# Patient Record
Sex: Female | Born: 1955 | Race: White | Hispanic: No | Marital: Married | Smoking: Never smoker
Health system: Southern US, Community
[De-identification: ages and names within clinical notes are randomized; demographics above are authoritative.]

## PROBLEM LIST (undated history)

## (undated) DIAGNOSIS — L4050 Arthropathic psoriasis, unspecified: Secondary | ICD-10-CM

## (undated) HISTORY — DX: Arthropathic psoriasis, unspecified: L40.50

## (undated) HISTORY — PX: TUBAL LIGATION: SHX77

---

## 2014-04-16 ENCOUNTER — Encounter (INDEPENDENT_AMBULATORY_CARE_PROVIDER_SITE_OTHER): Payer: Self-pay

## 2014-04-16 ENCOUNTER — Other Ambulatory Visit (HOSPITAL_COMMUNITY): Payer: Self-pay | Admitting: Rheumatology

## 2014-04-16 ENCOUNTER — Ambulatory Visit (HOSPITAL_COMMUNITY)
Admission: RE | Admit: 2014-04-16 | Discharge: 2014-04-16 | Disposition: A | Discharge status: Home / Self Care | Payer: 59 | Source: Ambulatory Visit | Attending: Rheumatology | Admitting: Rheumatology

## 2014-04-16 DIAGNOSIS — I10 Essential (primary) hypertension: Secondary | ICD-10-CM | POA: Insufficient documentation

## 2014-04-16 DIAGNOSIS — Z9225 Personal history of immunosupression therapy: Secondary | ICD-10-CM

## 2014-04-19 LAB — LAB REPORT - SCANNED
EGFR: 89
HM HIV Screening: NEGATIVE
TSH: 3.79 (ref 0.41–5.90)

## 2016-06-02 IMAGING — CR DG CHEST 2V
2 series · 2 of 2 positions shown · non-contrast
Comparison: None.

CLINICAL DATA: Hypertension and screen before immunosuppressive
therapy.

EXAM:
CHEST  2 VIEW

[w chest pa]
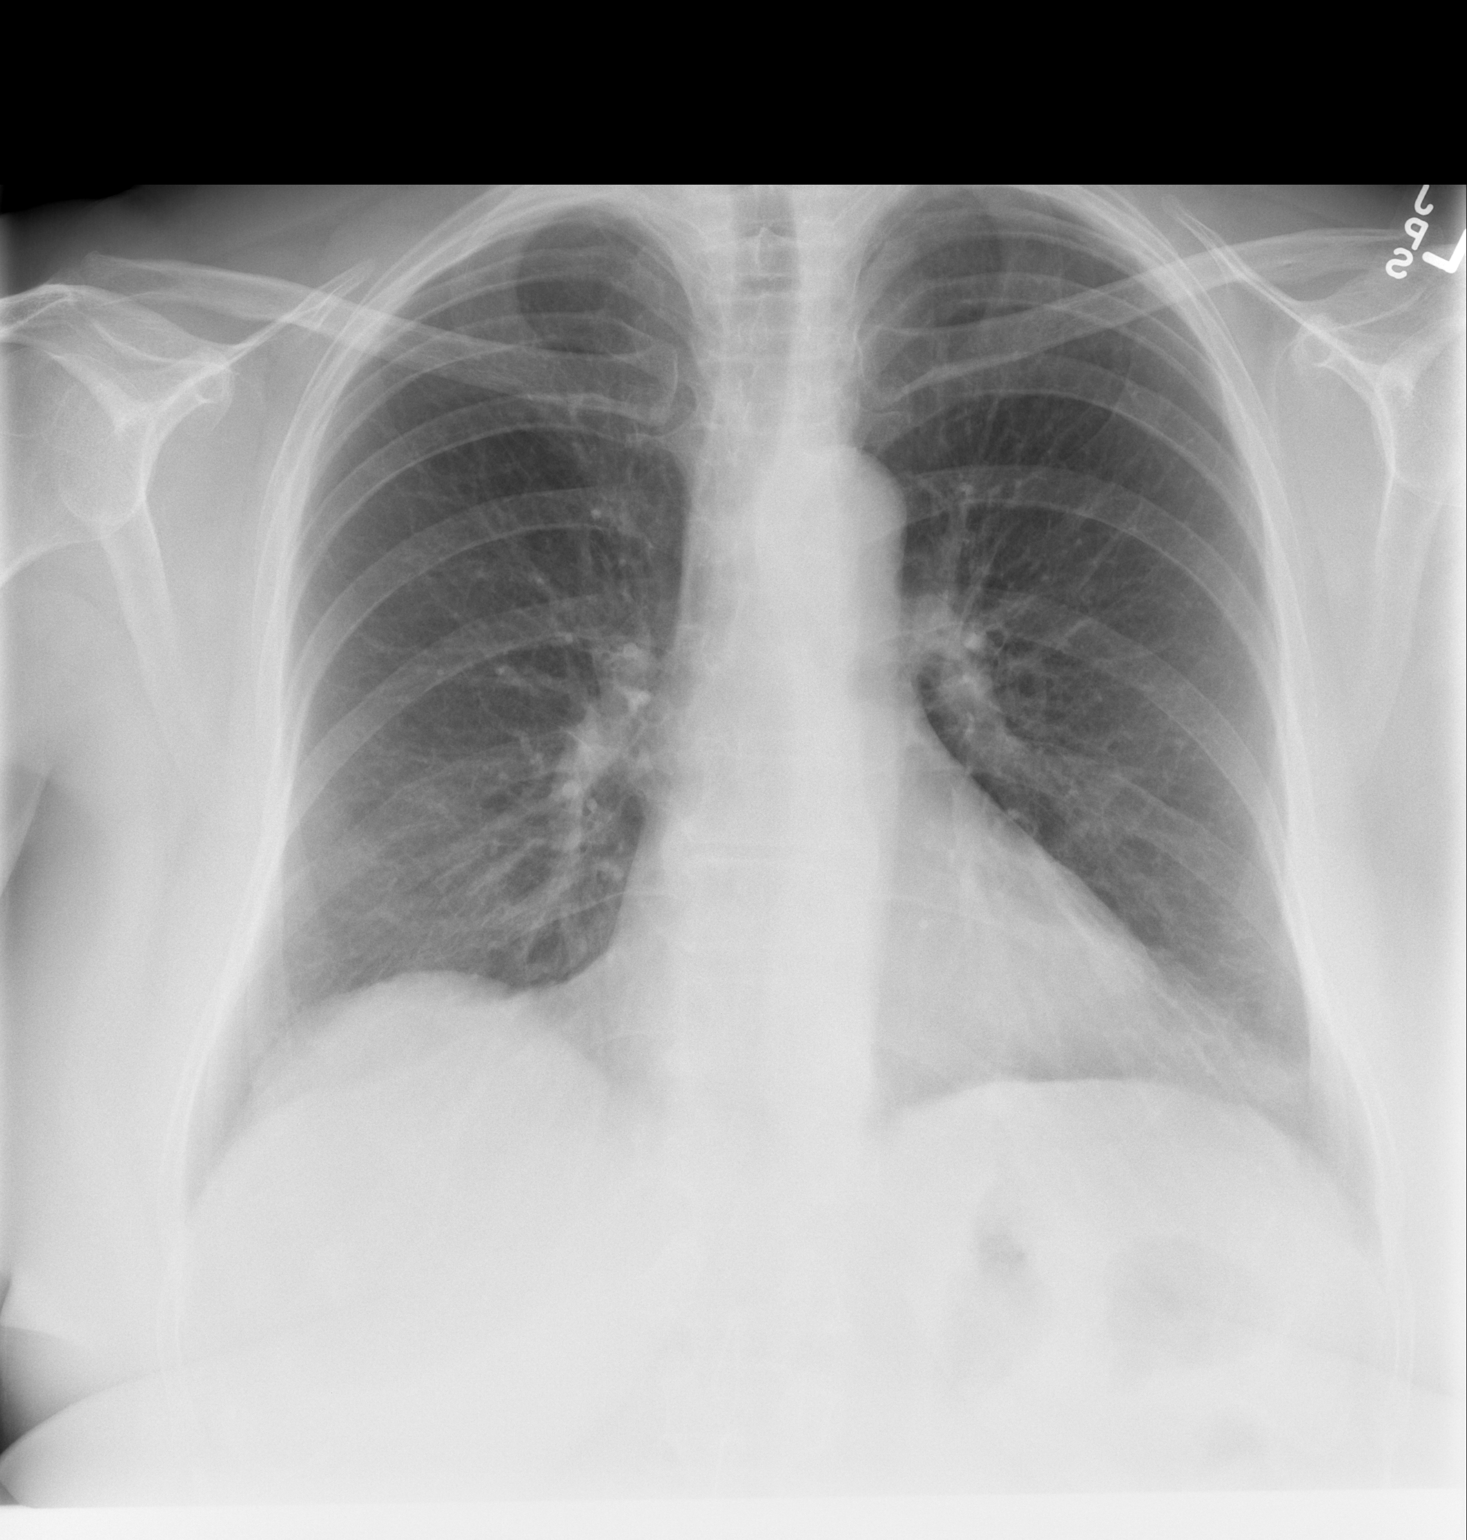

[w chest lat]
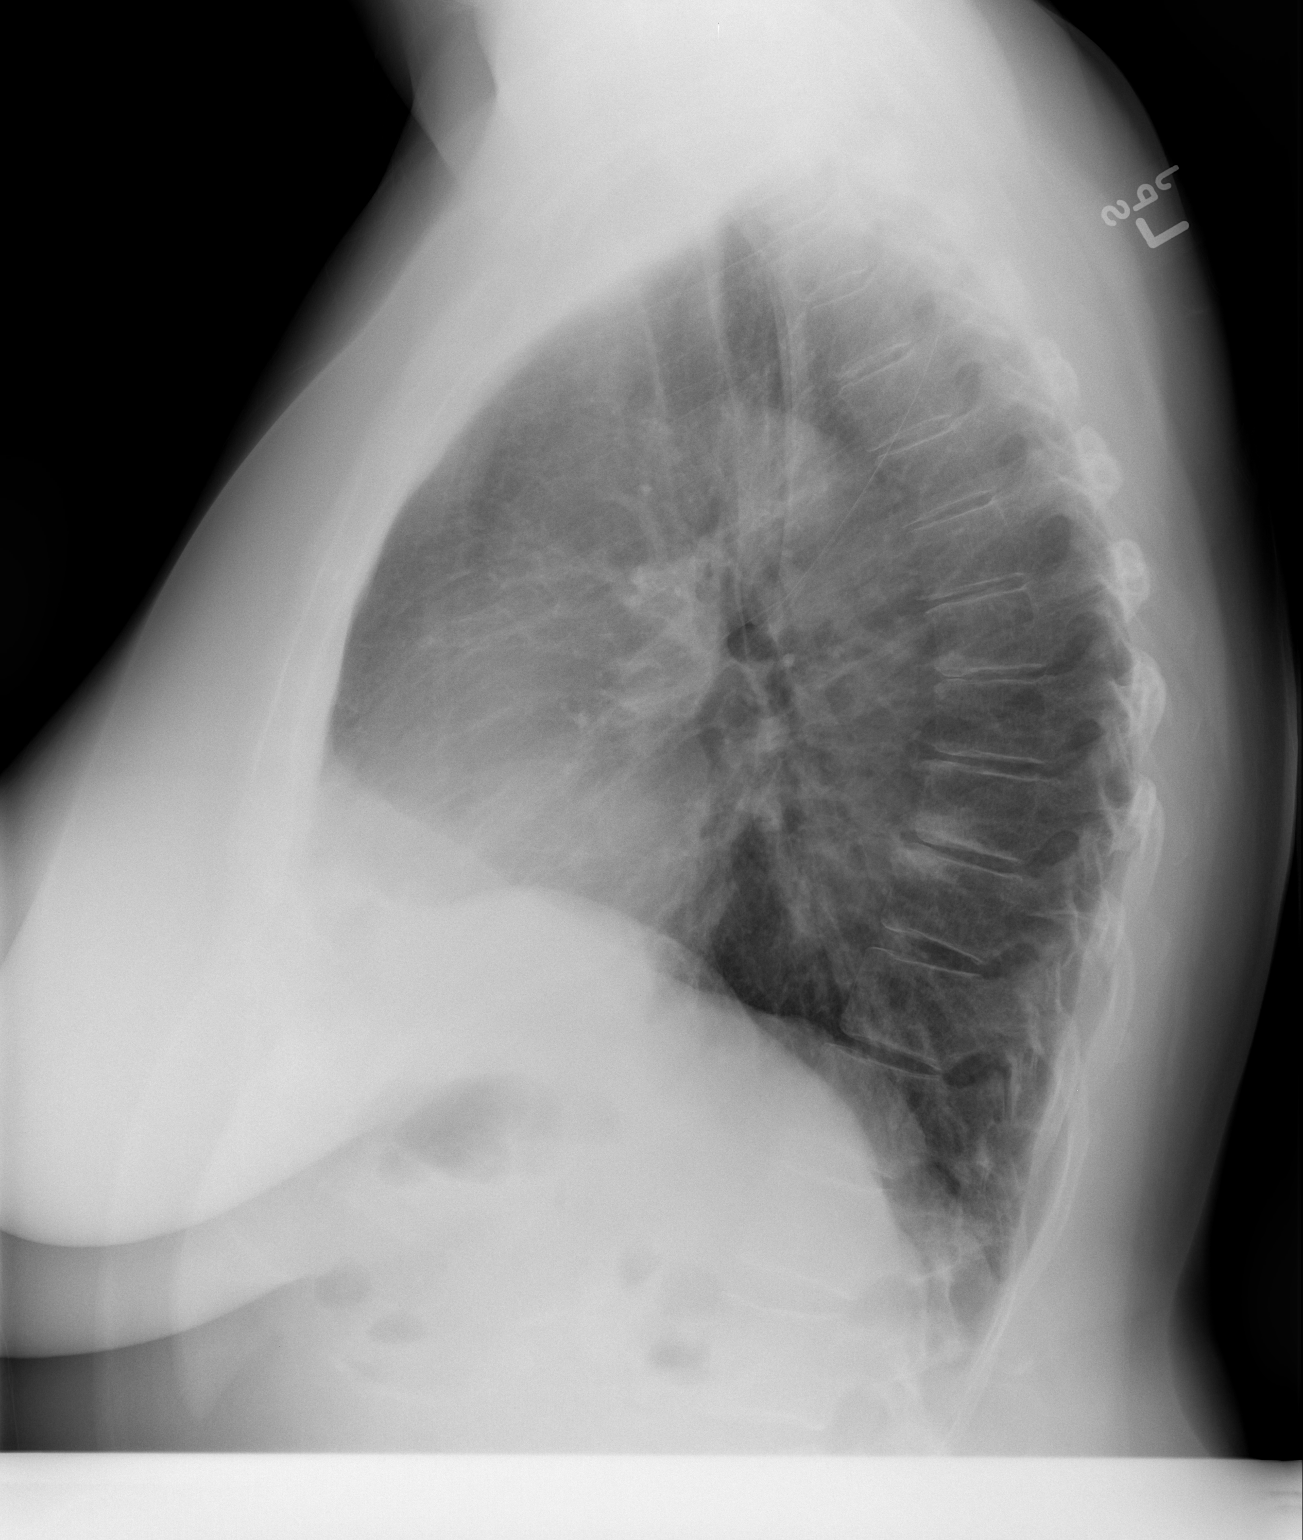

[2 of 2 positions shown; findings below may reference images not displayed]

FINDINGS: Two views of the chest demonstrate clear lungs. Heart and
mediastinum are within normal limits. Mild degenerative changes in
the thoracic spine. No evidence for pleural effusions. No acute bone
abnormality.
IMPRESSION: No active cardiopulmonary disease.

## 2017-01-16 DIAGNOSIS — M19071 Primary osteoarthritis, right ankle and foot: Secondary | ICD-10-CM | POA: Insufficient documentation

## 2017-01-16 DIAGNOSIS — L4050 Arthropathic psoriasis, unspecified: Secondary | ICD-10-CM | POA: Insufficient documentation

## 2017-01-16 DIAGNOSIS — L409 Psoriasis, unspecified: Secondary | ICD-10-CM | POA: Insufficient documentation

## 2017-01-16 DIAGNOSIS — M19042 Primary osteoarthritis, left hand: Secondary | ICD-10-CM

## 2017-01-16 DIAGNOSIS — M170 Bilateral primary osteoarthritis of knee: Secondary | ICD-10-CM | POA: Insufficient documentation

## 2017-01-16 DIAGNOSIS — M19041 Primary osteoarthritis, right hand: Secondary | ICD-10-CM | POA: Insufficient documentation

## 2017-01-16 DIAGNOSIS — M19072 Primary osteoarthritis, left ankle and foot: Secondary | ICD-10-CM

## 2017-01-16 NOTE — Progress Notes (Signed)
Office Visit Note  Patient: Karen Bryant             Date of Birth: 01-12-1956           MRN: [id number]             PCP: Kirstie Peri, MD Referring: Kirstie Peri, MD Visit Date: 01/23/2017 Occupation: @    Subjective:  Follow-up   History of Present Illness: Karen Bryant is a 61 y.o. female  Last seen in our office on 06/18/2014.  Patient has a history of psoriatic arthritis and psoriasis. She was put on Enbrel and she had done really well. Unfortunately the patient's mother had been very ill and was unable to come back to our office to take care of herself. She devoted her attention and time to her mother's needs first over hers.  Now she is having more flare of the psoriasis (look at the pictures inserted into her chart today) And she's having some joint pain especially to her feet and some to her hands.  She would like to restart seeing [place] in getting her psoriatic arthritis and psoriasis under control.  Currently she is not on any medication to address her psoriasis psoriatic arthritis. Patient does have pain from time to time and she takes Advil about 2 times a week to address that pain. Otherwise she does fairly well.    Activities of Daily Living:  Patient reports morning stiffness for 30 minutes.   Patient Reports nocturnal pain.  Difficulty dressing/grooming: Reports Difficulty climbing stairs: Reports Difficulty getting out of chair: Reports Difficulty using hands for taps, buttons, cutlery, and/or writing: Reports   Review of Systems  Constitutional: Negative for fatigue.  HENT: Negative for mouth sores and mouth dryness.   Eyes: Negative for dryness.  Respiratory: Negative for shortness of breath.   Gastrointestinal: Negative for constipation and diarrhea.  Musculoskeletal: Negative for myalgias and myalgias.  Skin: Negative for sensitivity to sunlight.  Psychiatric/Behavioral: Negative for decreased concentration and sleep disturbance.     PMFS History:  Patient Active Problem List   Diagnosis Date Noted  . High risk medication use 01/20/2017  . 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 Essential hypertension 01/20/2017  . History of depression 01/20/2017  . Psoriatic arthropathy (HCC) 01/16/2017  . Psoriasis 01/16/2017  . Primary osteoarthritis of both feet 01/16/2017  . Primary osteoarthritis of both hands 01/16/2017  . Primary osteoarthritis of both knees 01/16/2017    History reviewed. No pertinent past medical history.  History reviewed. No pertinent family history. History reviewed. No pertinent surgical history. Social History   Social History Narrative  . No narrative on file     Objective: Vital Signs: BP 128/78   Pulse 78   Resp 14   Ht  (1.651 m)   Wt 202 lb (91.6 kg)   BMI 33.61 kg/m    Physical Exam  Constitutional: She is oriented to person, place, and time. She appears well-developed and well-nourished.  HENT:  Head: Normocephalic and atraumatic.  Eyes: EOM are normal. Pupils are equal, round, and reactive to light.  Cardiovascular: Normal rate, regular rhythm and normal heart sounds.  Exam reveals no gallop and no friction rub.   No murmur heard. Pulmonary/Chest: Effort normal and breath sounds normal. She has no wheezes. She has no rales.  Abdominal: Soft. Bowel sounds are normal. She exhibits no distension. There is no tenderness. There is no guarding. No hernia.  Musculoskeletal: Normal range of motion. She exhibits no edema, tenderness or  deformity.  Lymphadenopathy:    She has no cervical adenopathy.  Neurological: She is alert and oriented to person, place, and time. Coordination normal.  Skin: Skin is warm and dry. Capillary refill takes less than 2 seconds. No rash noted.  Psychiatric: She has a normal mood and affect. Her behavior is normal.  Nursing note and vitals reviewed.    Musculoskeletal Exam:    CDAI Exam: CDAI Homunculus Exam:   Joint Counts:  CDAI Tender Joint  count: 0 CDAI Swollen Joint count: 0  Global Assessments:  Patient Global Assessment: 4 Provider Global Assessment: 4  CDAI Calculated Score: 8    Investigation: Findings:  July 2015; CBC, comprehensive metabolic panel, sed rate, hepatitis, CK, TSH, HIV, rheumatoid factor, CCP, C3, C4, ENA, UA were all within normal limits.  TB Gold was negative.  IgM was low at 36.  ANA was 1:160 nuclear speckled pattern.  We did obtain and x-ray of the lumbar spine due to ongoing lower back pain, which showed L4-5, L5-S1 narrowing and facet joint arthropathy consistent with disk disease.  Emelda Fear view showed mild left SI joint sclerosis, which could be due to psoriatic arthritis.   04/16/2014 X-ray of bilateral hands, 2 views, showed PIP/DIP narrowing without any erosive changes.  Bilateral feet x-rays showed PIP/DIP narrowing without erosive changes and bilateral calcaneal spurs.  Bilateral knee joint x-rays, 2 views, showed moderate medial compartment narrowing, mild patellofemoral narrowing.  No chondrocalcinosis.      Imaging: Xr Foot 2 Views Left  Result Date: 01/23/2017 #1: MTP spaces are within normal limits. #2: DIP PIP joint space narrowing #3: Good spacing in the intertarsal joints #4: Prominent bunion noted on the left foot Impression: Moderate osteoarthritis of foot with good MTP spacing with prominent bunion   Xr Foot 2 Views Right  Result Date: 01/23/2017 #1: MTP spaces are within normal limits. #2: DIP PIP joint space narrowing #3: Good spacing in the intertarsal joints Impression: Moderate osteoarthritis of foot with good MTP spacing  Xr Hand 2 View Left  Result Date: 01/23/2017 #1: All MCPs are joint space narrowing #2: PIP and DIP joint space narrowing #3: Some osteopenic changes to  MCPs PIPs and DIPs #4: Radiocarpal joint space narrowing #5: Intercarpal joint space maintained #6: No erosions noted. Impression: Moderate osteoarthritis noted at the MCP PIP/DIP joint with no erosions     Xr Hand 2 View Right  Result Date: 01/23/2017 #1: All MCPs are joint space narrowing #2: PIP and DIP joint space narrowing #3: Some osteopenic changes to  MCPs PIPs and DIPs #4: Radiocarpal joint space narrowing #5: Intercarpal joint space maintained #6: No erosions noted. Impression: Moderate osteoarthritis noted at the MCP PIP/DIP joint with no erosions                  Speciality Comments: No specialty comments available.    Procedures:  No procedures performed Allergies: Patient has no known allergies.   Assessment / Plan:     Visit Diagnoses: Psoriatic arthropathy (HCC) - SI joint sclerosis - Plan: XR Hand 2 View Left, XR Hand 2 View Right, XR Foot 2 Views Left, XR Foot 2 Views Right  Psoriasis - 01/23/2017: Off of Enbrel, flaring with psoriasis (doing well with joints)  High risk medication use - 01/23/2017: Apply for Enbrel (did well previously but stopped (approximately September 2015) - Plan: CBC with Differential/Platelet, COMPLETE METABOLIC PANEL WITH GFR, Quantiferon tb gold assay (blood)  Primary osteoarthritis of both hands - Plan: XR Hand  2 View Left, XR Hand 2 View Right, XR Foot 2 Views Left, XR Foot 2 Views Right  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   Plan: #1: Psoriasis and psoriatic arthritis. Patient was last seen in our office on 06/18/2014. She was placed on Enbrel and had done well with it but she had to stop seeing [place] because she was giving full-time care to her mother (who is very sick). Patient states "I had to put myself on the back burner". She is not ready to restart her medication if appropriate.  Currently having pain to her hands for which she takes  Advil 2 times a week. Most of her problem is the psoriasis flare.  #2: High risk prescription. Patient's last TB gold is expired and we'll need to do an updated TB goal today. We will also do a baseline CBC with differential and CMP with GFR Note that on 04/16/2014: Her rheumatoid factor was negative CCP was negative HIV with V was negative IgM was low at 36, ENA was negative, C3-C4 were normal, ANA was positive with a titer of 1:160.  She is not on any DMARD or Biologics at this time and we plan to consent and start her on Enbrel as soon as it is approved by her insurance company. Note: She did really well with Enbrel and she states "I could feel the improvement in just 2-3 shots after starting the medication.".  #3: History of OA of hands, feet, knee joint.  #4: Ongoing mild discomfort to hands, feet, knee joint (mild).  #5: CBC with differential, CMP with GFR, TB gold today  #6: Refill clobetasol ointment today  #7: Apply for Enbrel; after consent for Enbrel  #8: X-ray of bilateral hands and bilateral feet 2 views each  #9: Return to clinic in 2 months  Orders: Orders Placed This Encounter  Procedures  . XR Hand 2 View Left  . XR Hand 2 View Right  . XR Foot 2 Views Left  . XR Foot 2 Views Right  . CBC with Differential/Platelet  . COMPLETE METABOLIC PANEL WITH GFR  . Quantiferon tb gold assay (blood)   Meds ordered this encounter  Medications  . clobetasol ointment (TEMOVATE) 0.05 %    Sig: Apply 1 application topically 2 (two) times daily. Do not apply to face.    Dispense:  30 g    Refill:  2    Order Specific Question:   Supervising Provider    Answer:   Pollyann Savoy [phone]    Face-to-face time spent with patient was 30 minutes. 50% of time was spent in counseling and coordination of care.  Follow-Up Instructions: Return in about 3 months (around 04/24/2017) for PsA,Ps,restart_apply enbrel _flare of Ps_oa hands ,feet.   Tawni Pummel, PA-C Patient has  active psoriasis lesions on exam today as described above. She has some joint tenderness but not much synovitis. She does complain of arthralgias. We had detailed discussion regarding different treatment options. She has done well on Enbrel in the past we decided to move forward with Enbrel. Side effects were discussed at length. I examined and evaluated the patient with Tawni Pummel PA. The plan of care was discussed as  noted above.  Bo Merino, MD Note - This record has been created using Editor, commissioning.  Chart creation errors have been sought, but may not always  have been located. Such creation errors do not reflect on  the standard of medical care.

## 2017-01-20 DIAGNOSIS — Z8659 Personal history of other mental and behavioral disorders: Secondary | ICD-10-CM | POA: Insufficient documentation

## 2017-01-20 DIAGNOSIS — I10 Essential (primary) hypertension: Secondary | ICD-10-CM | POA: Insufficient documentation

## 2017-01-20 DIAGNOSIS — M47816 Spondylosis without myelopathy or radiculopathy, lumbar region: Secondary | ICD-10-CM | POA: Insufficient documentation

## 2017-01-20 DIAGNOSIS — Z79899 Other long term (current) drug therapy: Secondary | ICD-10-CM | POA: Insufficient documentation

## 2017-01-23 ENCOUNTER — Ambulatory Visit (INDEPENDENT_AMBULATORY_CARE_PROVIDER_SITE_OTHER): Payer: 59

## 2017-01-23 ENCOUNTER — Telehealth: Payer: Self-pay

## 2017-01-23 ENCOUNTER — Encounter: Payer: Self-pay | Admitting: Rheumatology

## 2017-01-23 ENCOUNTER — Ambulatory Visit (INDEPENDENT_AMBULATORY_CARE_PROVIDER_SITE_OTHER): Payer: 59 | Admitting: Rheumatology

## 2017-01-23 VITALS — BP 128/78 | HR 78 | Resp 14 | Ht 65.0 in | Wt 202.0 lb

## 2017-01-23 DIAGNOSIS — M19041 Primary osteoarthritis, right hand: Secondary | ICD-10-CM

## 2017-01-23 DIAGNOSIS — M170 Bilateral primary osteoarthritis of knee: Secondary | ICD-10-CM | POA: Diagnosis not present

## 2017-01-23 DIAGNOSIS — Z8659 Personal history of other mental and behavioral disorders: Secondary | ICD-10-CM

## 2017-01-23 DIAGNOSIS — M19042 Primary osteoarthritis, left hand: Secondary | ICD-10-CM

## 2017-01-23 DIAGNOSIS — M19072 Primary osteoarthritis, left ankle and foot: Secondary | ICD-10-CM

## 2017-01-23 DIAGNOSIS — M19071 Primary osteoarthritis, right ankle and foot: Secondary | ICD-10-CM | POA: Diagnosis not present

## 2017-01-23 DIAGNOSIS — L409 Psoriasis, unspecified: Secondary | ICD-10-CM

## 2017-01-23 DIAGNOSIS — L4050 Arthropathic psoriasis, unspecified: Secondary | ICD-10-CM

## 2017-01-23 DIAGNOSIS — I10 Essential (primary) hypertension: Secondary | ICD-10-CM

## 2017-01-23 DIAGNOSIS — Z79899 Other long term (current) drug therapy: Secondary | ICD-10-CM | POA: Diagnosis not present

## 2017-01-23 DIAGNOSIS — M47816 Spondylosis without myelopathy or radiculopathy, lumbar region: Secondary | ICD-10-CM

## 2017-01-23 LAB — CBC WITH DIFFERENTIAL/PLATELET
Basophils Absolute: 90 cells/uL (ref 0–200)
Basophils Relative: 2 %
EOS ABS: 45 {cells}/uL (ref 15–500)
Eosinophils Relative: 1 %
HCT: 44.7 % (ref 35.0–45.0)
Hemoglobin: 15.1 g/dL (ref 11.7–15.5)
LYMPHS ABS: 1935 {cells}/uL (ref 850–3900)
Lymphocytes Relative: 43 %
MCH: 29.1 pg (ref 27.0–33.0)
MCHC: 33.8 g/dL (ref 32.0–36.0)
MCV: 86.1 fL (ref 80.0–100.0)
MPV: 9.8 fL (ref 7.5–12.5)
Monocytes Absolute: 360 cells/uL (ref 200–950)
Monocytes Relative: 8 %
NEUTROS ABS: 2070 {cells}/uL (ref 1500–7800)
Neutrophils Relative %: 46 %
Platelets: 225 10*3/uL (ref 140–400)
RBC: 5.19 MIL/uL — AB (ref 3.80–5.10)
RDW: 14.2 % (ref 11.0–15.0)
WBC: 4.5 10*3/uL (ref 3.8–10.8)

## 2017-01-23 MED ORDER — CLOBETASOL PROPIONATE 0.05 % EX OINT: 1.0000 "application " | TOPICAL_OINTMENT | Freq: Two times a day (BID) | CUTANEOUS | 2 refills | Status: AC

## 2017-01-23 NOTE — Telephone Encounter (Signed)
A prior authorization for Enbrel Mini Auto Injector was submitted for patient through cover my meds.

## 2017-01-23 NOTE — Progress Notes (Signed)
Pharmacy Note  Subjective: Patient presents today to the [facility] to see Dr. Gabrielle Dare. [place].   Patient seen by the pharmacist for counseling on Enbrel.    Objective: TB Test: ordered today Hepatitis panel: negative (04/19/2014) HIV: negative (04/19/2014)  CBC, CMP: ordered today  Assessment/Plan:  Counseled patient that Enbrel is a TNF blocking agent.  Reviewed Enbrel dose of 50 mg once weekly.  Counseled patient on purpose, proper use, and adverse effects of Enbrel.  Reviewed the most common adverse effects including infections, headache, and injection site reactions. Discussed that there is the possibility of an increased risk of malignancy but it is not well understood if this increased risk is due to the medication or the disease state.  Advised patient to get yearly dermatology exams due to risk of skin cancer.  Reviewed the importance of regular labs while on Enbrel therapy.  Counseled patient that Enbrel should be held prior to scheduled surgery.  Counseled patient to avoid live vaccines while on Enbrel.  Advised patient to get annual influenza vaccine and the pneumococcal vaccine if she has not already had one.  Provided patient with medication education material and answered all questions.  Patient voiced understanding.  Patient consented to Enbrel.  Will upload consent into the media tab.  Will apply for Enbrel through patient's insurance.  Reviewed storage instructions for Enbrel.  Advised patient the first injection must be administered in the office.  Patient voiced understanding.    Lilla Shook, Pharm.D., BCPS Clinical Pharmacist Pager: [phone] Phone: [phone] 01/23/2017 11:57 AM

## 2017-01-23 NOTE — Patient Instructions (Signed)
Etanercept injection What is this medicine? ETANERCEPT (et a NER sept) is used for the treatment of rheumatoid arthritis in adults and children. The medicine is also used to treat psoriatic arthritis, ankylosing spondylitis, and psoriasis. This medicine may be used for other purposes; ask your health care provider or pharmacist if you have questions. COMMON BRAND NAME(S): Enbrel What should I tell my health care provider before I take this medicine? They need to know if you have any of these conditions: -blood disorders -cancer -congestive heart failure -diabetes -exposure to chickenpox -immune system problems -infection -multiple sclerosis -seizure disorder -tuberculosis, a positive skin test for tuberculosis or have recently been in close contact with someone who has tuberculosis -Wegener's granulomatosis -an unusual or allergic reaction to etanercept, latex, other medicines, foods, dyes, or preservatives -pregnant or trying to get pregnant -breast-feeding How should I use this medicine? The medicine is given by injection under the skin. You will be taught how to prepare and give this medicine. Use exactly as directed. Take your medicine at regular intervals. Do not take your medicine more often than directed. It is important that you put your used needles and syringes in a special sharps container. Do not put them in a trash can. If you do not have a sharps container, call your pharmacist or healthcare provider to get one. A special MedGuide will be given to you by the pharmacist with each prescription and refill. Be sure to read this information carefully each time. Talk to your pediatrician regarding the use of this medicine in children. While this drug may be prescribed for children as young as 4 years of age for selected conditions, precautions do apply. Overdosage: If you think you have taken too much of this medicine contact a poison control center or emergency room at once. NOTE:  This medicine is only for you. Do not share this medicine with others. What if I miss a dose? If you miss a dose, contact your health care professional to find out when you should take your next dose. Do not take double or extra doses without advice. What may interact with this medicine? Do not take this medicine with any of the following medications: -anakinra This medicine may also interact with the following medications: -cyclophosphamide -sulfasalazine -vaccines This list may not describe all possible interactions. Give your health care provider a list of all the medicines, herbs, non-prescription drugs, or dietary supplements you use. Also tell them if you smoke, drink alcohol, or use illegal drugs. Some items may interact with your medicine. What should I watch for while using this medicine? Tell your doctor or healthcare professional if your symptoms do not start to get better or if they get worse. You will be tested for tuberculosis (TB) before you start this medicine. If your doctor prescribes any medicine for TB, you should start taking the TB medicine before starting this medicine. Make sure to finish the full course of TB medicine. Call your doctor or health care professional for advice if you get a fever, chills or sore throat, or other symptoms of a cold or flu. Do not treat yourself. This drug decreases your body's ability to fight infections. Try to avoid being around people who are sick. What side effects may I notice from receiving this medicine? Side effects that you should report to your doctor or health care professional as soon as possible: -allergic reactions like skin rash, itching or hives, swelling of the face, lips, or tongue -changes in vision -fever, chills   or any other sign of infection -numbness or tingling in legs or other parts of the body -red, scaly patches or raised bumps on the skin -shortness of breath or difficulty breathing -swollen lymph nodes in the  neck, underarm, or groin areas -unexplained weight loss -unusual bleeding or bruising -unusual swelling or fluid retention in the legs -unusually weak or tired Side effects that usually do not require medical attention (report to your doctor or health care professional if they continue or are bothersome): -dizziness -headache -nausea -redness, itching, or swelling at the injection site -vomiting This list may not describe all possible side effects. Call your doctor for medical advice about side effects. You may report side effects to FDA at 1-800-FDA-1088. Where should I keep my medicine? Keep out of the reach of children. Store between 2 and 8 degrees C (36 and 46 degrees F). Do not freeze or shake. Protect from light. Throw away any unused medicine after the expiration date. You will be instructed on how to store this medicine. NOTE: This sheet is a summary. It may not cover all possible information. If you have questions about this medicine, talk to your doctor, pharmacist, or health care provider.  2018 Elsevier/Gold Standard (2012-04-02 15:33:36)  

## 2017-01-24 LAB — COMPLETE METABOLIC PANEL WITH GFR
ALT: 19 U/L (ref 6–29)
AST: 17 U/L (ref 10–35)
Albumin: 4.1 g/dL (ref 3.6–5.1)
Alkaline Phosphatase: 65 U/L (ref 33–130)
BILIRUBIN TOTAL: 0.9 mg/dL (ref 0.2–1.2)
BUN: 12 mg/dL (ref 7–25)
CALCIUM: 9.3 mg/dL (ref 8.6–10.4)
CO2: 22 mmol/L (ref 20–31)
Chloride: 106 mmol/L (ref 98–110)
Creat: 0.47 mg/dL — ABNORMAL LOW (ref 0.50–1.05)
GFR, Est Non African American: >89 mL/min (ref >=60)
Glucose, Bld: 90 mg/dL (ref 65–99)
Potassium: 4.5 mmol/L (ref 3.5–5.3)
Sodium: 140 mmol/L (ref 135–146)
TOTAL PROTEIN: 6.8 g/dL (ref 6.1–8.1)

## 2017-01-25 LAB — QUANTIFERON TB GOLD ASSAY (BLOOD)
Interferon Gamma Release Assay: NEGATIVE
MITOGEN-NIL SO: 9.73 [IU]/mL
Quantiferon Nil Value: 0.03 IU/mL

## 2017-02-01 NOTE — Telephone Encounter (Signed)
Called [employer] to check status of patient's prior auth. Spoke with Tresa Endo who states that the authorization is still in process. The request was submitted for standard review (12-14 days). I could be up to six day before we have an answer.   Will check back and contact the patient when we hear a response.   Reference number: [id number] Phone number: [phone]  Abran Duke, CPhT 1:42 PM

## 2017-02-02 ENCOUNTER — Telehealth: Payer: Self-pay | Admitting: Pharmacist

## 2017-02-02 NOTE — Telephone Encounter (Signed)
I tried calling patient to see if she does not mind switching to Humira since her insurance has denied Enbrel.I was unable to reach her.Please reach out to patient to see if she does not mind switching to Humira q 2weeksHer insurance is requiring her to fail HUMIRA  before they would approve Enbrel.

## 2017-02-02 NOTE — Telephone Encounter (Signed)
Enbrel PA for Karen Bryant was denied because patient has not had a history of failure, contraindication, or intolerancec of two of the following preferred products: Cimzia, Humira, Simponi, Stelara.   Patient has psoriasis/psoriatic arthritis.  We applied for Enbrel because she has history of positive response to the medication.  Do you want me to appeal the denial or apply/consent for one of the other preferred medications?

## 2017-02-03 ENCOUNTER — Telehealth: Payer: Self-pay

## 2017-02-03 NOTE — Telephone Encounter (Signed)
Submitted a prior authorization to insurance through cover my meds. Will contact the patient once we receive a response.   Annaliza Zia, [place], CPhT 2:40 PM

## 2017-02-03 NOTE — Telephone Encounter (Signed)
I spoke to patient who is agreeable to try Humira instead of Enbrel.  Will submit prior approval for Humira.    If approved, is it okay for patient to schedule nurse visit for Humira consent/initial injection and start Humira using a sample pen?

## 2017-02-03 NOTE — Telephone Encounter (Signed)
Yes, ok to schedule nurse visit for humira injection once approved Thank you

## 2017-02-07 NOTE — Telephone Encounter (Signed)
Received a fax from [employer] regarding a prior authorization approval for Humira through 02/03/18   Reference number: [id number] Phone [phone]  Will send document to scan center.  Laval Cafaro, [place], CPhT  2:47 PM

## 2017-02-08 NOTE — Telephone Encounter (Signed)
I called patient regarding Humira approval and left a message on her machine.  Patient will need to schedule nurse visit for Humira consent and initial Humira inejction (okay to use sample medication per Mr. Leane Call).   Lilla Shook, Pharm.D., BCPS, CPP Clinical Pharmacist Pager: [phone] Phone: [phone] 02/08/2017 8:51 AM

## 2017-02-14 NOTE — Telephone Encounter (Signed)
Received return call from patient.  She scheduled nurse visit for 02/15/17 at 2:00 PM for initial Humira injection.   Lilla Shookachel Warrick Llera, Pharm.D., BCPS, CPP Clinical Pharmacist Pager: [phone] Phone: [phone] 02/14/2017 8:24 AM

## 2017-02-15 ENCOUNTER — Ambulatory Visit (INDEPENDENT_AMBULATORY_CARE_PROVIDER_SITE_OTHER): Payer: 59 | Admitting: *Deleted

## 2017-02-15 VITALS — BP 140/83 | HR 59

## 2017-02-15 DIAGNOSIS — L4050 Arthropathic psoriasis, unspecified: Secondary | ICD-10-CM

## 2017-02-15 MED ORDER — ADALIMUMAB 40 MG/0.8ML ~~LOC~~ AJKT: 40.0000 mg | AUTO-INJECTOR | SUBCUTANEOUS | 2 refills | Status: DC

## 2017-02-15 MED ORDER — ADALIMUMAB 40 MG/0.8ML ~~LOC~~ PSKT
40.0000 mg | PREFILLED_SYRINGE | Freq: Once | SUBCUTANEOUS | Status: AC
  Administered 2017-02-15: 40 mg via SUBCUTANEOUS

## 2017-02-15 NOTE — Progress Notes (Signed)
Pharmacy Note Subjective: Patient presents today to the [facility] for her initial Humira injection.   Patient seen by the pharmacist for counseling on Humira.    Objective: TB Test: negative (01/23/17) Hepatitis panel: negative (04/19/2014) HIV: negative (04/19/2014)  CBC    Component Value Date/Time   WBC 4.5 01/23/2017 1236   RBC 5.19 (H) 01/23/2017 1236   HGB 15.1 01/23/2017 1236   HCT 44.7 01/23/2017 1236   PLT 225 01/23/2017 1236   MCV 86.1 01/23/2017 1236   MCH 29.1 01/23/2017 1236   MCHC 33.8 01/23/2017 1236   RDW 14.2 01/23/2017 1236   LYMPHSABS 1,935 01/23/2017 1236   MONOABS 360 01/23/2017 1236   EOSABS 45 01/23/2017 1236   BASOSABS 90 01/23/2017 1236    Assessment/Plan:  Counseled patient that Humira is a TNF blocking agent.  Reviewed Humira dose of 40 mg every other week.  Counseled patient on purpose, proper use, and adverse effects of Humira.  Reviewed the most common adverse effects including infections, headache, and injection site reactions. Discussed that there is the possibility of an increased risk of malignancy but it is not well understood if this increased risk is due to the medication or the disease state.  Advised patient to get yearly dermatology exams due to risk of skin cancer.  Reviewed the importance of regular labs while on Humira therapy.  Advised patient to get standing labs one month after starting Humira.  Provided patient with standing lab orders.  Counseled patient that Humira should be held prior to scheduled surgery.  Counseled patient to avoid live vaccines while on Humira.  Advised patient to get annual influenza vaccine and the pneumococcal vaccine as needed.  Provided patient with medication education material and answered all questions.  Patient voiced understanding.  Patient consented to Humira.  Will upload consent into the media tab.  Reviewed storage instructions of Humira.  Patient voiced understanding.    Patient received  initial Humira injection in clinic today.  Advised patient to get labs in 1 month then every 3 months.  Patient voiced understanding.  Patient is scheduled for follow up visit on 03/24/17.    Lilla Shookachel Henderson, Pharm.D., BCPS Clinical Pharmacist Pager: [phone] Phone: [phone] 02/15/2017 2:17 PM

## 2017-02-15 NOTE — Patient Instructions (Addendum)
Your prescription was sent to [facility].  Their phone number is [phone].   Standing Labs We placed an order today for your standing lab work.    Please come back and get your standing labs in 1 month then every 3 months  We have open lab Monday through Friday from 8:30-11:30 AM and 1:30-4 PM at the office of Dr. Arbutus PedShaili Deveshwar/Naitik Panwala, PA.   The office is located at [address], [address], [place], [place] [id number] No appointment is necessary.   Labs are drawn by [facility].  You may receive a bill from [facility] for your lab work.    Adalimumab Injection What is this medicine? ADALIMUMAB (a dal AYE mu mab) is used to treat rheumatoid and psoriatic arthritis. It is also used to treat ankylosing spondylitis, Crohn's disease, ulcerative colitis, plaque psoriasis, hidradenitis suppurativa, and uveitis. This medicine may be used for other purposes; ask your health care provider or pharmacist if you have questions. COMMON BRAND NAME(S): CYLTEZO, Humira What should I tell my health care provider before I take this medicine? They need to know if you have any of these conditions: -diabetes -heart disease -hepatitis B or history of hepatitis B infection -immune system problems -infection or history of infections -multiple sclerosis -recently received or scheduled to receive a vaccine -scheduled to have surgery -tuberculosis, a positive skin test for tuberculosis or have recently been in close contact with someone who has tuberculosis -an unusual reaction to adalimumab, other medicines, mannitol, latex, rubber, foods, dyes, or preservatives -pregnant or trying to get pregnant -breast-feeding How should I use this medicine? This medicine is for injection under the skin. You will be taught how to prepare and give this medicine. Use exactly as directed. Take your medicine at regular intervals. Do not take your medicine more often than directed. A special MedGuide will  be given to you by the pharmacist with each prescription and refill. Be sure to read this information carefully each time. It is important that you put your used needles and syringes in a special sharps container. Do not put them in a trash can. If you do not have a sharps container, call your pharmacist or healthcare provider to get one. Talk to your pediatrician regarding the use of this medicine in children. While this drug may be prescribed for children as young as 2 years for selected conditions, precautions do apply. The manufacturer of the medicine offers free information to patients and their health care partners. Call [phone] for more information. Overdosage: If you think you have taken too much of this medicine contact a poison control center or emergency room at once. NOTE: This medicine is only for you. Do not share this medicine with others. What if I miss a dose? If you miss a dose, take it as soon as you can. If it is almost time for your next dose, take only that dose. Do not take double or extra doses. Give the next dose when your next scheduled dose is due. Call your doctor or health care professional if you are not sure how to handle a missed dose. What may interact with this medicine? Do not take this medicine with any of the following medications: -abatacept -anakinra -etanercept -infliximab -live virus vaccines -rilonacept This medicine may also interact with the following medications: -vaccines This list may not describe all possible interactions. Give your health care provider a list of all the medicines, herbs, non-prescription drugs, or dietary supplements you use. Also tell them if  you smoke, drink alcohol, or use illegal drugs. Some items may interact with your medicine. What should I watch for while using this medicine? Visit your doctor or health care professional for regular checks on your progress. Tell your doctor or healthcare professional if your symptoms  do not start to get better or if they get worse. You will be tested for tuberculosis (TB) before you start this medicine. If your doctor prescribes any medicine for TB, you should start taking the TB medicine before starting this medicine. Make sure to finish the full course of TB medicine. Call your doctor or health care professional if you get a cold or other infection while receiving this medicine. Do not treat yourself. This medicine may decrease your body's ability to fight infection. Talk to your doctor about your risk of cancer. You may be more at risk for certain types of cancers if you take this medicine. What side effects may I notice from receiving this medicine? Side effects that you should report to your doctor or health care professional as soon as possible: -allergic reactions like skin rash, itching or hives, swelling of the face, lips, or tongue -breathing problems -changes in vision -chest pain -fever, chills, or any other sign of infection -numbness or tingling -red, scaly patches or raised bumps on the skin -swelling of the ankles -swollen lymph nodes in the neck, underarm, or groin areas -unexplained weight loss -unusual bleeding or bruising -unusually weak or tired Side effects that usually do not require medical attention (report to your doctor or health care professional if they continue or are bothersome): -headache -nausea -redness, itching, swelling, or bruising at site where injected This list may not describe all possible side effects. Call your doctor for medical advice about side effects. You may report side effects to FDA at 1-800-FDA-1088. Where should I keep my medicine? Keep out of the reach of children. Store in the original container and in the refrigerator between 2 and 8 degrees C (36 and 46 degrees F). Do not freeze. The product may be stored in a cool carrier with an ice pack, if needed. Protect from light. Throw away any unused medicine after the  expiration date. NOTE: This sheet is a summary. It may not cover all possible information. If you have questions about this medicine, talk to your doctor, pharmacist, or health care provider.  2018 Elsevier/Gold Standard (2015-04-15 11:11:43)

## 2017-02-15 NOTE — Progress Notes (Signed)
Patient in office for initial start to Humira. Patient was given the injection in her right thigh and tolerated injection well. Patient was monitored for 30 minutes after injection was administered for adverse reactions. No adverse reactions noted.   Administrations This Visit    Adalimumab PSKT 40 mg    Admin Date 02/15/2017 Action Given Dose 40 mg Route Subcutaneous Administered By Henriette CombsHatton, Andrea L, LPN

## 2017-03-23 NOTE — Progress Notes (Signed)
Office Visit Note  Patient: Karen Bryant             Date of Birth: January 02, 1956           MRN: [id number]             PCP: Monico Blitz, MD Referring: Monico Blitz, MD Visit Date: 03/24/2017 Occupation: _0 @    Subjective:  Follow-up   History of Present Illness: Karen Bryant is a 61 y.o. female  Last seen 01/23/2017 for psoriatic arthritis , psoriasis. Before this April visit in 2018, she was last seen in our office on 06/18/2014. Patient states that her mother was very sick and she was not able to come to our office but because her psoriasis and joints became painful, she wanted to restart care at our office.  Initially, patient did really well with the Enbrel. Unfortunately on the April visit, we were unable to restart the patient on Enbrel because her TB go was expired and we needed to update her labs.  Note that on 04/16/2014: Her rheumatoid factor was negative CCP was negative HIV with V was negative IgM was low at 36, ENA was negative, C3-C4 were normal, ANA was positive with a titer of 1:160.  The updated labs from 01/25/2017 were as follows: Notes recorded by Eliezer Lofts, PA-C on 01/25/2017 at 5:27 PM EDT Send copy of labs to PCP And tell patient #1: TB cold is negative #2: CMP with GFR and CBC with differential is within normal limits  We applied for Enbrel but it was not approved. Instead Humira was approved. She received her first Humira injection in office on 02/15/2017.  Today she returns for follow-up.    Activities of Daily Living:  Patient reports morning stiffness for 15 minute.   Patient Denies nocturnal pain.  Difficulty dressing/grooming: Denies Difficulty climbing stairs: Denies Difficulty getting out of chair: Denies Difficulty using hands for taps, buttons, cutlery, and/or writing: Denies   No Rheumatology ROS completed.   PMFS History:  Patient Active Problem List   Diagnosis Date Noted  . High risk medication use 01/20/2017  . DDD  lumbar spine 01/20/2017  . Essential hypertension 01/20/2017  . History of depression 01/20/2017  . Psoriatic arthropathy ([place]) 01/16/2017  . Psoriasis 01/16/2017  . Primary osteoarthritis of both feet 01/16/2017  . Primary osteoarthritis of both hands 01/16/2017  . Primary osteoarthritis of both knees 01/16/2017    Past Medical History:  Diagnosis Date  . Psoriatic arthritis ([place])     History reviewed. No pertinent family history. Past Surgical History:  Procedure Laterality Date  . TUBAL LIGATION     Social History   Social History Narrative  . No narrative on file     Objective: Vital Signs: BP (!) 148/69 (BP Location: Left Arm, Patient Position: Sitting, Cuff Size: Normal)   Pulse (!) 53   Resp 15   Ht 5' 5" (1.651 m)   Wt 176 lb (79.8 kg)   BMI 29.29 kg/m    Physical Exam   Musculoskeletal Exam:  Full range of motion of all joints Grip strength is equal and strong bilaterally Fiber myalgia tender points are all absent  CDAI Exam: CDAI Homunculus Exam:   Tenderness:  Right hand: 1st MCP and 2nd MCP Left hand: 1st MCP  Swelling:  Right hand: 1st MCP and 2nd MCP Left hand: 1st MCP  Joint Counts:  CDAI Tender Joint count: 3 CDAI Swollen Joint count: 3  Global Assessments:  Patient Global Assessment: 5  Provider Global Assessment: 5  CDAI Calculated Score: 16    Investigation: No additional findings. Office Visit on 01/23/2017  Component Date Value Ref Range Status  . WBC 01/23/2017 4.5  3.8 - 10.8 K/uL Final  . RBC 01/23/2017 5.19* 3.80 - 5.10 MIL/uL Final  . Hemoglobin 01/23/2017 15.1  11.7 - 15.5 g/dL Final  . HCT 01/23/2017 44.7  35.0 - 45.0 % Final  . MCV 01/23/2017 86.1  80.0 - 100.0 fL Final  . MCH 01/23/2017 29.1  27.0 - 33.0 pg Final  . MCHC 01/23/2017 33.8  32.0 - 36.0 g/dL Final  . RDW 01/23/2017 14.2  11.0 - 15.0 % Final  . Platelets 01/23/2017 225  140 - 400 K/uL Final  . MPV 01/23/2017 9.8  7.5 - 12.5 fL Final  . Neutro Abs  01/23/2017 2070  1,500 - 7,800 cells/uL Final  . Lymphs Abs 01/23/2017 1935  850 - 3,900 cells/uL Final  . Monocytes Absolute 01/23/2017 360  200 - 950 cells/uL Final  . Eosinophils Absolute 01/23/2017 45  15 - 500 cells/uL Final  . Basophils Absolute 01/23/2017 90  0 - 200 cells/uL Final  . Neutrophils Relative % 01/23/2017 46  % Final  . Lymphocytes Relative 01/23/2017 43  % Final  . Monocytes Relative 01/23/2017 8  % Final  . Eosinophils Relative 01/23/2017 1  % Final  . Basophils Relative 01/23/2017 2  % Final  . Smear Review 01/23/2017 Criteria for review not met   Final  . Sodium 01/23/2017 140  135 - 146 mmol/L Final  . Potassium 01/23/2017 4.5  3.5 - 5.3 mmol/L Final  . Chloride 01/23/2017 106  98 - 110 mmol/L Final  . CO2 01/23/2017 22  20 - 31 mmol/L Final  . Glucose, Bld 01/23/2017 90  65 - 99 mg/dL Final  . BUN 01/23/2017 12  7 - 25 mg/dL Final  . Creat 01/23/2017 0.47* 0.50 - 1.05 mg/dL Final   Comment:   For patients > or = 60 years of age: The upper reference limit for Creatinine is approximately 13% higher for people identified as African-American.     . Total Bilirubin 01/23/2017 0.9  0.2 - 1.2 mg/dL Final  . Alkaline Phosphatase 01/23/2017 65  33 - 130 U/L Final  . AST 01/23/2017 17  10 - 35 U/L Final  . ALT 01/23/2017 19  6 - 29 U/L Final  . Total Protein 01/23/2017 6.8  6.1 - 8.1 g/dL Final  . Albumin 01/23/2017 4.1  3.6 - 5.1 g/dL Final  . Calcium 01/23/2017 9.3  8.6 - 10.4 mg/dL Final  . GFR, Est African American 01/23/2017 >89  >=60 mL/min Final  . GFR, Est Non African American 01/23/2017 >89  >=60 mL/min Final  . Interferon Gamma Release Assay 01/23/2017 NEGATIVE  NEGATIVE Final   Negative test result. M. tuberculosis complex infection unlikely.  . Quantiferon Nil Value 01/23/2017 0.03  IU/mL Final  . Mitogen-Nil 01/23/2017 9.73  IU/mL Final  . Quantiferon Tb Ag Minus Nil Value 01/23/2017 <0.00  IU/mL Final   Comment:   The Nil tube value is used to  determine if the patient has a preexisting immune response which could cause a false-positive reading on the test. In order for a test to be valid, the Nil tube must have a value of less than or equal to 8.0 IU/mL.   The mitogen control tube is used to assure the patient has a healthy immune status and also serves as a control for  correct blood handling and incubation. It is used to detect false-negative readings. The mitogen tube must have a gamma interferon value of greater than or equal to 0.5 IU/mL higher than the value of the Nil tube.   The TB antigen tube is coated with the M. tuberculosis specific antigens. For a test to be considered positive, the TB antigen tube value minus the Nil tube value must be greater than or equal to 0.35 IU/mL.   For additional information, please refer to http://education.questdiagnostics.com/faq/QFT (This link is being provided for informational/educational purposes only.)      Imaging: No results found.          Note: Patient psoriasis is much improved after 3 injections of Humira. Her joints are showing adequate response. Her right second and first MCP have some synovial thickening and tenderness. Her left first MCP has some synovial thickening and tenderness.   Speciality Comments: No specialty comments available.    Procedures:  No procedures performed Allergies: Patient has no known allergies.   Assessment / Plan:     Visit Diagnoses: Psoriatic arthropathy ([place])  Psoriasis  High risk medication use  Primary osteoarthritis of both knees  Primary osteoarthritis of both hands  Primary osteoarthritis of both feet   Plan: #1: Psoriatic arthritis and psoriasis are well controlled with Humira which was started about 6 weeks ago. (See history of present illness for full details Patient has had 3 injections so far  She does have swelling and stiffness to her right first and second MCP joint and left first MCP  joint.  I think with 2 more months of Humira, that probably will resolve.  Her psoriasis is doing very well. Majority of her psoriasis is healed/cleared up  #2: High risk prescription On Humira every 2 weeks Has had 3 injections so far. Patient is doing well and getting adequate response  #3: OA of bilateral knees. Some pain to the knees.  #4 OA of bilateral hands. Ongoing discomfort  #5: OA of bilateral feet.. Occasional pain.  #6: Patient is due for labs today. She'll need CBC with differential and CMP with GFR. TB gold was negative April 2018 Patient was advised to see dermatologist for an annual skin check.  #7: Labs will be due in 3 months and she'll get them through [place] in each [facility]  Orders: No orders of the defined types were placed in this encounter.  No orders of the defined types were placed in this encounter.   Face-to-face time spent with patient was 30 minutes. 50% of time was spent in counseling and coordination of care.  Follow-Up Instructions: No Follow-up on file.   Eliezer Lofts, PA-C   I examined and evaluated the patient with Eliezer Lofts PA. The plan of care was discussed as noted above.  Bo Merino, MD Note - This record has been created using Editor, commissioning.  Chart creation errors have been sought, but may not always  have been located. Such creation errors do not reflect on  the standard of medical care.

## 2017-03-24 ENCOUNTER — Ambulatory Visit (INDEPENDENT_AMBULATORY_CARE_PROVIDER_SITE_OTHER): Payer: 59 | Admitting: Rheumatology

## 2017-03-24 ENCOUNTER — Encounter: Payer: Self-pay | Admitting: Rheumatology

## 2017-03-24 VITALS — BP 148/69 | HR 53 | Resp 15 | Ht 65.0 in | Wt 176.0 lb

## 2017-03-24 DIAGNOSIS — Z79899 Other long term (current) drug therapy: Secondary | ICD-10-CM

## 2017-03-24 DIAGNOSIS — L4050 Arthropathic psoriasis, unspecified: Secondary | ICD-10-CM

## 2017-03-24 DIAGNOSIS — M19071 Primary osteoarthritis, right ankle and foot: Secondary | ICD-10-CM

## 2017-03-24 DIAGNOSIS — M19072 Primary osteoarthritis, left ankle and foot: Secondary | ICD-10-CM | POA: Diagnosis not present

## 2017-03-24 DIAGNOSIS — M19041 Primary osteoarthritis, right hand: Secondary | ICD-10-CM | POA: Diagnosis not present

## 2017-03-24 DIAGNOSIS — M19042 Primary osteoarthritis, left hand: Secondary | ICD-10-CM

## 2017-03-24 DIAGNOSIS — L409 Psoriasis, unspecified: Secondary | ICD-10-CM | POA: Diagnosis not present

## 2017-03-24 DIAGNOSIS — M170 Bilateral primary osteoarthritis of knee: Secondary | ICD-10-CM

## 2017-03-24 LAB — CBC WITH DIFFERENTIAL/PLATELET
Basophils Absolute: 48 cells/uL (ref 0–200)
Basophils Relative: 1 %
EOS PCT: 1 %
Eosinophils Absolute: 48 cells/uL (ref 15–500)
HCT: 43.5 % (ref 35.0–45.0)
Hemoglobin: 14.6 g/dL (ref 11.7–15.5)
LYMPHS PCT: 43 %
Lymphs Abs: 2064 cells/uL (ref 850–3900)
MCH: 29.1 pg (ref 27.0–33.0)
MCHC: 33.6 g/dL (ref 32.0–36.0)
MCV: 86.7 fL (ref 80.0–100.0)
MPV: 10.2 fL (ref 7.5–12.5)
Monocytes Absolute: 480 cells/uL (ref 200–950)
Monocytes Relative: 10 %
NEUTROS PCT: 45 %
Neutro Abs: 2160 cells/uL (ref 1500–7800)
Platelets: 183 10*3/uL (ref 140–400)
RBC: 5.02 MIL/uL (ref 3.80–5.10)
RDW: 15.3 % — ABNORMAL HIGH (ref 11.0–15.0)
WBC: 4.8 10*3/uL (ref 3.8–10.8)

## 2017-03-24 LAB — COMPLETE METABOLIC PANEL WITH GFR
ALBUMIN: 4.3 g/dL (ref 3.6–5.1)
ALK PHOS: 57 U/L (ref 33–130)
ALT: 15 U/L (ref 6–29)
AST: 18 U/L (ref 10–35)
BUN: 5 mg/dL — AB (ref 7–25)
CALCIUM: 9.1 mg/dL (ref 8.6–10.4)
CHLORIDE: 102 mmol/L (ref 98–110)
CO2: 22 mmol/L (ref 20–31)
Creat: 0.66 mg/dL (ref 0.50–1.05)
GFR, Est African American: >89 mL/min (ref >=60)
Glucose, Bld: 84 mg/dL (ref 65–99)
POTASSIUM: 4.6 mmol/L (ref 3.5–5.3)
SODIUM: 137 mmol/L (ref 135–146)
Total Bilirubin: 1.4 mg/dL — ABNORMAL HIGH (ref 0.2–1.2)
Total Protein: 6.2 g/dL (ref 6.1–8.1)

## 2017-05-14 ENCOUNTER — Other Ambulatory Visit: Payer: Self-pay | Admitting: Rheumatology

## 2017-05-15 NOTE — Telephone Encounter (Signed)
Last Visit: 03/24/17 Next Visit: 08/25/17 Labs: 03/24/17 WNL TB Gold: 01/23/17 Neg  Okay to refill per Dr. Corliss Skainseveshwar

## 2017-06-14 LAB — CBC WITH DIFFERENTIAL/PLATELET
BASOS ABS: 0 10*3/uL (ref 0.0–0.2)
BASOS: 1 %
EOS (ABSOLUTE): 0 10*3/uL (ref 0.0–0.4)
Eos: 1 %
Hematocrit: 43 % (ref 34.0–46.6)
Hemoglobin: 13.8 g/dL (ref 11.1–15.9)
IMMATURE GRANS (ABS): 0 10*3/uL (ref 0.0–0.1)
Immature Granulocytes: 0 %
LYMPHS: 39 %
Lymphocytes Absolute: 1.6 10*3/uL (ref 0.7–3.1)
MCH: 29 pg (ref 26.6–33.0)
MCHC: 32.1 g/dL (ref 31.5–35.7)
MCV: 90 fL (ref 79–97)
Monocytes Absolute: 0.3 10*3/uL (ref 0.1–0.9)
Monocytes: 9 %
NEUTROS PCT: 50 %
Neutrophils Absolute: 2 10*3/uL (ref 1.4–7.0)
PLATELETS: 227 10*3/uL (ref 150–379)
RBC: 4.76 x10E6/uL (ref 3.77–5.28)
RDW: 15.2 % (ref 12.3–15.4)
WBC: 4 10*3/uL (ref 3.4–10.8)

## 2017-06-14 LAB — CMP14+EGFR
A/G RATIO: 1.9 (ref 1.2–2.2)
ALK PHOS: 62 IU/L (ref 39–117)
ALT: 15 IU/L (ref 0–32)
AST: 20 IU/L (ref 0–40)
Albumin: 4.1 g/dL (ref 3.6–4.8)
BUN/Creatinine Ratio: 13 (ref 12–28)
BUN: 7 mg/dL — ABNORMAL LOW (ref 8–27)
Bilirubin Total: 1.1 mg/dL (ref 0.0–1.2)
CALCIUM: 8.9 mg/dL (ref 8.7–10.3)
CO2: 24 mmol/L (ref 20–29)
Chloride: 104 mmol/L (ref 96–106)
Creatinine, Ser: 0.52 mg/dL — ABNORMAL LOW (ref 0.57–1.00)
GFR calc Af Amer: 120 mL/min/{1.73_m2} (ref >59)
GFR, EST NON AFRICAN AMERICAN: 104 mL/min/{1.73_m2} (ref >59)
Globulin, Total: 2.2 g/dL (ref 1.5–4.5)
Glucose: 98 mg/dL (ref 65–99)
POTASSIUM: 5 mmol/L (ref 3.5–5.2)
Sodium: 141 mmol/L (ref 134–144)
Total Protein: 6.3 g/dL (ref 6.0–8.5)

## 2017-06-14 NOTE — Progress Notes (Signed)
WNL

## 2017-07-27 ENCOUNTER — Telehealth: Payer: Self-pay

## 2017-07-27 NOTE — Telephone Encounter (Signed)
Received two faxes from [facility] requesting an ICD-10 code for patient and that they were having unsuccessful attempts at contacting the patient.   Called [facility] and spoke with Steward DroneBrenda who updated the account. The medication was delivered on October 5th. We do not need to reach out to the patient at this time.   Will send documents to scan center.  Karen Bryant, [place], CPhT 10:03 AM

## 2017-07-31 ENCOUNTER — Other Ambulatory Visit: Payer: Self-pay | Admitting: Rheumatology

## 2017-07-31 NOTE — Telephone Encounter (Signed)
Last Visit: 03/24/17 Next Visit: 08/25/17 Labs: 06/13/17 WNL TB Gold: 01/23/17 Neg  Okay to refill per Dr. Corliss Skainseveshwar

## 2017-08-25 ENCOUNTER — Ambulatory Visit: Payer: 59 | Admitting: Rheumatology

## 2017-08-31 NOTE — Progress Notes (Signed)
Office Visit Note  Patient: Karen Bryant Schoeller             Date of Birth: 1956-02-05           MRN: [id number]             PCP: Kirstie PeriShah, Ashish, MD Referring: Kirstie PeriShah, Ashish, MD Visit Date: 09/13/2017 Occupation: @GUAROCC @    Subjective:  Occasional joint swelling in hands.   History of Present Illness: Karen Bryant Takagi is a 61 y.o. female with history of psoriatic arthritis and osteoarthritis overlap. According to her she does get intermittent joint swelling in her hands. She relates it to eating salty food. Her psoriasis is very well controlled. Knee joint pain is tolerable. She has noticed a knot on her right foot. Lower back pain is tolerable.  Activities of Daily Living:  Patient reports morning stiffness for 5 minutes.   Patient Denies nocturnal pain.  Difficulty dressing/grooming: Denies Difficulty climbing stairs: Denies Difficulty getting out of chair: Denies Difficulty using hands for taps, buttons, cutlery, and/or writing: Denies   Review of Systems  Constitutional: Positive for fatigue. Negative for night sweats, weight gain, weight loss and weakness.  HENT: Negative for mouth sores, trouble swallowing, trouble swallowing, mouth dryness and nose dryness.   Eyes: Negative for pain, redness, visual disturbance and dryness.  Respiratory: Negative for cough, shortness of breath and difficulty breathing.   Cardiovascular: Negative for chest pain, palpitations, hypertension, irregular heartbeat and swelling in legs/feet.  Gastrointestinal: Negative for blood in stool, constipation and diarrhea.  Endocrine: Negative for increased urination.  Genitourinary: Negative for vaginal dryness.  Musculoskeletal: Positive for arthralgias, joint pain and morning stiffness. Negative for joint swelling, myalgias, muscle weakness, muscle tenderness and myalgias.  Skin: Negative for color change, rash, hair loss, skin tightness, ulcers and sensitivity to sunlight.  Allergic/Immunologic: Negative for  susceptible to infections.  Neurological: Negative for dizziness, memory loss and night sweats.  Hematological: Negative for swollen glands.  Psychiatric/Behavioral: Negative for depressed mood and sleep disturbance. The patient is not nervous/anxious.     PMFS History:  Patient Active Problem List   Diagnosis Date Noted  . High risk medication use 01/20/2017  . 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 Essential hypertension 01/20/2017  . History of depression 01/20/2017  . Psoriatic arthropathy (HCC) 01/16/2017  . Psoriasis 01/16/2017  . Primary osteoarthritis of both feet 01/16/2017  . Primary osteoarthritis of both hands 01/16/2017  . Primary osteoarthritis of both knees 01/16/2017    Past Medical History:  Diagnosis Date  . Psoriatic arthritis (HCC)     Family History  Problem Relation Age of Onset  . Alzheimer's disease Mother   . Heart failure Mother   . Parkinson's disease Father   . Cancer Father        prostate  . Cancer Sister        breast cancer   . Healthy Daughter   . Healthy Daughter    Past Surgical History:  Procedure Laterality Date  . TUBAL LIGATION     Social History   Social History Narrative  . Not on file     Objective: Vital Signs: BP 133/79 (BP Location: Left Arm, Patient Position: Sitting, Cuff Size: Normal)   Pulse 66   Resp 15   Ht 5\' 5"  (1.651 m)   Wt 169 lb (76.7 kg)   BMI 28.12 kg/m    Physical Exam  Constitutional: She is oriented to person, place, and time. She appears well-developed and well-nourished.  HENT:  Head: Normocephalic and atraumatic.  Eyes: Conjunctivae and EOM are normal.  Neck: Normal range of motion.  Cardiovascular: Normal rate, regular rhythm, normal heart sounds and intact distal pulses.  Pulmonary/Chest: Effort normal and breath sounds normal.  Abdominal: Soft. Bowel sounds are normal.  Lymphadenopathy:    She has no cervical adenopathy.  Neurological: She is alert and oriented to person, place, and time.    Skin: Skin is warm and dry. Capillary refill takes less than 2 seconds.  Psychiatric: She has a normal mood and affect. Her behavior is normal.  Nursing note and vitals reviewed.    Musculoskeletal Exam: C-spine and thoracic lumbar spine good range of motion. She had no SI joint tenderness. Shoulder joints elbow joints wrist joints with good range of motion. She had DIP PIP thickening in her hands and feet consistent with osteoarthritis. 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 There was no evidence of Achilles tendinitis or plantar fasciitis. She has a small cyst on dorsum of her right foot.  CDAI Exam: CDAI Homunculus Exam:   Joint Counts:  CDAI Tender Joint count: 0 CDAI Swollen Joint count: 0  Global Assessments:  Patient Global Assessment: 2 Provider Global Assessment: 2  CDAI Calculated Score: 4    Investigation: No additional findings.TB Gold: 01/23/2017 Negative  CBC Latest Ref Rng & Units 06/13/2017 03/24/2017 01/23/2017  WBC 3.4 - 10.8 x10E3/uL 4.0 4.8 4.5  Hemoglobin 11.1 - 15.9 g/dL 16.1 09.6 04.5  Hematocrit 34.0 - 46.6 % 43.0 43.5 44.7  Platelets 150 - 379 x10E3/uL 227 183 225   CMP Latest Ref Rng & Units 06/13/2017 03/24/2017 01/23/2017  Glucose 65 - 99 mg/dL 98 84 90  BUN 8 - 27 mg/dL 7(L) 5(L) 12  Creatinine 0.57 - 1.00 mg/dL 4.09(W) 1.19 1.47(W)  Sodium 134 - 144 mmol/L 141 137 140  Potassium 3.5 - 5.2 mmol/L 5.0 4.6 4.5  Chloride 96 - 106 mmol/L 104 102 106  CO2 20 - 29 mmol/L 24 22 22   Calcium 8.7 - 10.3 mg/dL 8.9 9.1 9.3  Total Protein 6.0 - 8.5 g/dL 6.3 6.2 6.8  Total Bilirubin 0.0 - 1.2 mg/dL 1.1 2.9(F) 0.9  Alkaline Phos 39 - 117 IU/L 62 57 65  AST 0 - 40 IU/L 20 18 17   ALT 0 - 32 IU/L 15 15 19     Imaging: No results found.  Speciality Comments: No specialty comments available.    Procedures:  No procedures performed Allergies: Patient has no known allergies.   Assessment / Plan:     Visit Diagnoses: Psoriatic arthropathy  [facility]): Patient had no synovitis on examination she is clinically doing well on Humira. She's occasional discomfort in her hands which she relates to her dietary changes.: She has no active lesions of psoriasis.  Psoriasis  High risk medication use - Humira  - Plan: CBC with Differential/Platelet, COMPLETE METABOLIC PANEL WITH GFR. Her labs are stable. We will check her labs today and then every 3 months to monitor for drug toxicity.  TB gold is due in April as she is on long-term immunosuppressive therapy.  Primary osteoarthritis of both hands: Joint protection and muscle strengthening discussed.  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 She also has left foot bunion and hammertoes. Proper fitting shoes with good support were discussed.  DDD lumbar spine: Doing well  Other fatigue - Plan: VITAMIN D 25 Hydroxy (Vit-D Deficiency, Fractures)  I've advised patient  to discuss her bone density test with Dr. Sherryll BurgerShah.  History of hypertension: Blood pressure is mildly elevated she will continue to monitor.    Orders: Orders Placed This Encounter  Procedures  . CBC with Differential/Platelet  . COMPLETE METABOLIC PANEL WITH GFR  . VITAMIN D 25 Hydroxy (Vit-D Deficiency, Fractures)   No orders of the defined types were placed in this encounter.   Face-to-face time spent with patient was 30 minutes. Greater than 50% of time was spent in counseling and coordination of care.  Follow-Up Instructions: Return in about 5 months (around 02/11/2018) for Psoriatic arthritis.   Pollyann SavoyShaili Twanda Stakes, MD  Note - This record has been created using Animal nutritionistDragon software.  Chart creation errors have been sought, but may not always  have been located. Such creation errors do not reflect on  the standard of medical care.

## 2017-09-13 ENCOUNTER — Other Ambulatory Visit: Payer: Self-pay

## 2017-09-13 ENCOUNTER — Ambulatory Visit: Payer: 59 | Admitting: Rheumatology

## 2017-09-13 ENCOUNTER — Encounter: Payer: Self-pay | Admitting: Rheumatology

## 2017-09-13 VITALS — BP 133/79 | HR 66 | Resp 15 | Ht 65.0 in | Wt 169.0 lb

## 2017-09-13 DIAGNOSIS — L4050 Arthropathic psoriasis, unspecified: Secondary | ICD-10-CM | POA: Diagnosis not present

## 2017-09-13 DIAGNOSIS — Z79899 Other long term (current) drug therapy: Secondary | ICD-10-CM | POA: Diagnosis not present

## 2017-09-13 DIAGNOSIS — M170 Bilateral primary osteoarthritis of knee: Secondary | ICD-10-CM | POA: Diagnosis not present

## 2017-09-13 DIAGNOSIS — L409 Psoriasis, unspecified: Secondary | ICD-10-CM | POA: Diagnosis not present

## 2017-09-13 DIAGNOSIS — M19042 Primary osteoarthritis, left hand: Secondary | ICD-10-CM

## 2017-09-13 DIAGNOSIS — R5383 Other fatigue: Secondary | ICD-10-CM | POA: Diagnosis not present

## 2017-09-13 DIAGNOSIS — M19072 Primary osteoarthritis, left ankle and foot: Secondary | ICD-10-CM | POA: Diagnosis not present

## 2017-09-13 DIAGNOSIS — M19041 Primary osteoarthritis, right hand: Secondary | ICD-10-CM

## 2017-09-13 DIAGNOSIS — Z8679 Personal history of other diseases of the circulatory system: Secondary | ICD-10-CM

## 2017-09-13 DIAGNOSIS — M47816 Spondylosis without myelopathy or radiculopathy, lumbar region: Secondary | ICD-10-CM

## 2017-09-13 DIAGNOSIS — M19071 Primary osteoarthritis, right ankle and foot: Secondary | ICD-10-CM

## 2017-09-13 MED ORDER — ADALIMUMAB 40 MG/0.8ML ~~LOC~~ AJKT: 40.0000 mg | AUTO-INJECTOR | SUBCUTANEOUS | 0 refills | Status: DC

## 2017-09-13 NOTE — Patient Instructions (Addendum)
Standing Labs We placed an order today for your standing lab work.    Please come back and get your standing labs in March(CBC, CMP with GFR) and every 3 months TB Gold is due in March or April    We have open lab Monday through Friday from 8:30-11:30 AM and 1:30-4 PM at the office of Dr. Pollyann SavoyShaili Justa Hatchell.   The office is located at [address], [address], [place], [place] [address] No appointment is necessary.   Labs are drawn by [facility].  You may receive a bill from [facility] for your lab work. If you have any questions regarding directions or hours of operation,  please call [phone].     Please discuss repeat Bone density test with Dr. Sherryll BurgerShah

## 2017-09-13 NOTE — Progress Notes (Signed)
Last visit: 09/13/2017 Next visit: 02/16/2018 Labs: 09/13/2017 TB Gold: 01/23/2017 Negative

## 2017-09-14 ENCOUNTER — Telehealth: Payer: Self-pay | Admitting: *Deleted

## 2017-09-14 ENCOUNTER — Other Ambulatory Visit: Payer: Self-pay | Admitting: *Deleted

## 2017-09-14 DIAGNOSIS — E559 Vitamin D deficiency, unspecified: Secondary | ICD-10-CM

## 2017-09-14 LAB — COMPLETE METABOLIC PANEL WITH GFR
AG Ratio: 1.8 (calc) (ref 1.0–2.5)
ALBUMIN MSPROF: 4.2 g/dL (ref 3.6–5.1)
ALT: 23 U/L (ref 6–29)
AST: 23 U/L (ref 10–35)
Alkaline phosphatase (APISO): 74 U/L (ref 33–130)
BILIRUBIN TOTAL: 0.7 mg/dL (ref 0.2–1.2)
BUN: 16 mg/dL (ref 7–25)
CALCIUM: 9.3 mg/dL (ref 8.6–10.4)
CHLORIDE: 105 mmol/L (ref 98–110)
CO2: 29 mmol/L (ref 20–32)
Creat: 0.63 mg/dL (ref 0.50–0.99)
GFR, EST AFRICAN AMERICAN: 113 mL/min/{1.73_m2} (ref >=60)
GFR, EST NON AFRICAN AMERICAN: 97 mL/min/{1.73_m2} (ref >=60)
GLUCOSE: 89 mg/dL (ref 65–99)
Globulin: 2.4 g/dL (calc) (ref 1.9–3.7)
Potassium: 4.9 mmol/L (ref 3.5–5.3)
Sodium: 141 mmol/L (ref 135–146)
TOTAL PROTEIN: 6.6 g/dL (ref 6.1–8.1)

## 2017-09-14 LAB — CBC WITH DIFFERENTIAL/PLATELET
BASOS PCT: 1.1 %
Basophils Absolute: 69 cells/uL (ref 0–200)
EOS ABS: 50 {cells}/uL (ref 15–500)
Eosinophils Relative: 0.8 %
HCT: 42.5 % (ref 35.0–45.0)
HEMOGLOBIN: 14.4 g/dL (ref 11.7–15.5)
Lymphs Abs: 1682 cells/uL (ref 850–3900)
MCH: 30.2 pg (ref 27.0–33.0)
MCHC: 33.9 g/dL (ref 32.0–36.0)
MCV: 89.1 fL (ref 80.0–100.0)
MONOS PCT: 7.1 %
MPV: 11.1 fL (ref 7.5–12.5)
NEUTROS ABS: 4051 {cells}/uL (ref 1500–7800)
Neutrophils Relative %: 64.3 %
PLATELETS: 231 10*3/uL (ref 140–400)
RBC: 4.77 10*6/uL (ref 3.80–5.10)
RDW: 12.6 % (ref 11.0–15.0)
Total Lymphocyte: 26.7 %
WBC: 6.3 10*3/uL (ref 3.8–10.8)
WBCMIX: 447 {cells}/uL (ref 200–950)

## 2017-09-14 LAB — VITAMIN D 25 HYDROXY (VIT D DEFICIENCY, FRACTURES): Vit D, 25-Hydroxy: 13 ng/mL — ABNORMAL LOW (ref 30–100)

## 2017-09-14 MED ORDER — VITAMIN D (ERGOCALCIFEROL) 1.25 MG (50000 UNIT) PO CAPS: 50000.0000 [IU] | ORAL_CAPSULE | ORAL | 0 refills | Status: DC

## 2017-09-14 NOTE — Progress Notes (Signed)
Vit D 50000U twice a week for 3 mths. Repeat level in 3 months.

## 2017-09-14 NOTE — Telephone Encounter (Signed)
-----   Message from Pollyann SavoyShaili Deveshwar, MD sent at 09/14/2017  8:43 AM EST ----- Vit D 50000U twice a week for 3 mths. Repeat level in 3 months.

## 2017-12-15 ENCOUNTER — Telehealth: Payer: Self-pay | Admitting: Rheumatology

## 2017-12-15 NOTE — Telephone Encounter (Signed)
Patient called stating she has one more refill remaining for her Humira which "will get her through til the end of April."  Patient wanted the office to know that she has an appointment scheduled on 5/10.

## 2018-01-05 ENCOUNTER — Other Ambulatory Visit: Payer: Self-pay | Admitting: Rheumatology

## 2018-01-05 NOTE — Telephone Encounter (Addendum)
Last Visit: 09/13/17 Next visit: 02/16/18 Labs: 09/13/17 cbc/cmp wnl TB Gold: 01/23/17 neg   Left message to advise patient she is due for labs  Okay to refill 30 day supply per Dr. Corliss Skainseveshwar

## 2018-01-23 ENCOUNTER — Telehealth: Payer: Self-pay

## 2018-01-23 NOTE — Telephone Encounter (Signed)
Received a [facility] from [facility] regarding a prior authorization approval for HUMIRA PEN from 01/23/2018 to 01/24/2019.   Reference number: [id number] Phone number: [phone]  Will send document to scan center.  Called pt to update. Left message.  Ociel Retherford, [place], CPhT 11:34 AM

## 2018-01-23 NOTE — Telephone Encounter (Signed)
Received a prior authorization request for Humira Pens generated from cover my meds. Authorization has been submitted to pts insurance. Will ulpdate once we have a response.   Karen Bryant, [place], CPhT 8:30 AM

## 2018-02-01 ENCOUNTER — Ambulatory Visit: Payer: 59 | Admitting: Rheumatology

## 2018-02-02 ENCOUNTER — Other Ambulatory Visit: Payer: Self-pay | Admitting: Rheumatology

## 2018-02-02 NOTE — Telephone Encounter (Addendum)
Last Visit: 09/13/17 Next visit: 02/16/18 Labs: 09/13/17 cbc/cmp wnl TB Gold: 01/23/17 neg   Left message to advise patient she is due to update labs.   Okay to refill 30 day supply per Dr. Corliss Skainseveshwar

## 2018-02-08 NOTE — Progress Notes (Signed)
Office Visit Note  Patient: Karen Bryant             Date of Birth: [birth year]           MRN: [id number]             PCP: Kirstie Peri, MD Referring: Kirstie Peri, MD Visit Date: 02/22/2018 Occupation: @    Subjective:  Medication monitoring    History of Present Illness: Karen Bryant is a 62 y.o. female with history of psoriatic arthritis and osteoarthritis.  Patient has been off of Humira for 1 month due to needing an office visit and lab work.  She states that over the past month she has developed increased swelling and stiffness in her bilateral hands.  She denies any pain in her feet but feels that her toes are becoming a little more deformed.  She states that her bilateral knee joints are doing well with no swelling.  She denies any active psoriasis.  She denies any plantar fasciitis or Achilles tendinitis.  She states that her lower back causes discomfort and stiffness at times but denies any SI joint pain at this time.   Activities of Daily Living:  Patient reports morning stiffness for 15  minutes.   Patient Denies nocturnal pain.  Difficulty dressing/grooming: Denies Difficulty climbing stairs: Reports Difficulty getting out of chair: Denies Difficulty using hands for taps, buttons, cutlery, and/or writing: Reports   Review of Systems  Constitutional: Positive for fatigue.  HENT: Negative for mouth sores, mouth dryness and nose dryness.   Eyes: Negative for pain, visual disturbance and dryness.  Respiratory: Negative for cough, hemoptysis, shortness of breath and difficulty breathing.   Cardiovascular: Negative for chest pain, palpitations, hypertension and swelling in legs/feet.  Gastrointestinal: Negative for blood in stool, constipation and diarrhea.  Endocrine: Negative for increased urination.  Genitourinary: Negative for painful urination.  Musculoskeletal: Positive for arthralgias, joint pain, joint swelling and morning stiffness. Negative for  myalgias, muscle weakness, muscle tenderness and myalgias.  Skin: Positive for nodules/bumps. Negative for color change, pallor, rash, hair loss, skin tightness, ulcers and sensitivity to sunlight.  Allergic/Immunologic: Negative for susceptible to infections.  Neurological: Negative for dizziness, numbness, headaches and weakness.  Hematological: Negative for swollen glands.  Psychiatric/Behavioral: Positive for sleep disturbance. Negative for depressed mood. The patient is not nervous/anxious.     PMFS History:  Patient Active Problem List   Diagnosis Date Noted  . High risk medication use 01/20/2017  . 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 Essential hypertension 01/20/2017  . History of depression 01/20/2017  . Psoriatic arthropathy (HCC) 01/16/2017  . Psoriasis 01/16/2017  . Primary osteoarthritis of both feet 01/16/2017  . Primary osteoarthritis of both hands 01/16/2017  . Primary osteoarthritis of both knees 01/16/2017    Past Medical History:  Diagnosis Date  . Psoriatic arthritis (HCC)     Family History  Problem Relation Age of Onset  . Alzheimer's disease Mother   . Heart failure Mother   . Parkinson's disease Father   . Cancer Father        prostate  . Cancer Sister        breast cancer   . Healthy Daughter   . Healthy Daughter    Past Surgical History:  Procedure Laterality Date  . TUBAL LIGATION     Social History   Social History Narrative  . Not on file     Objective: Vital Signs: BP (!) 142/85 (BP Location: Left Arm, Patient Position: Sitting, Cuff  Size: Normal)   Pulse 74   Resp 14   Ht  (1.651 m)   Wt 194 lb (88 kg)   BMI 32.28 kg/m    Physical Exam  Constitutional: She is oriented to person, place, and time. She appears well-developed and well-nourished.  HENT:  Head: Normocephalic and atraumatic.  Eyes: Conjunctivae and EOM are normal.  Neck: Normal range of motion.  Cardiovascular: Normal rate, regular rhythm, normal heart sounds and  intact distal pulses.  Pulmonary/Chest: Effort normal and breath sounds normal.  Abdominal: Soft. Bowel sounds are normal.  Lymphadenopathy:    She has no cervical adenopathy.  Neurological: She is alert and oriented to person, place, and time.  Skin: Skin is warm and dry. Capillary refill takes less than 2 seconds.  Psychiatric: She has a normal mood and affect. Her behavior is normal.  Nursing note and vitals reviewed.    Musculoskeletal Exam: C-spine, thoracic spine, lumbar spine good range of motion.  No midline spinal tenderness.  No SI joint tenderness.  Shoulder joints, elbow joints, wrist joints, MCPs, PIPs, DIPs good range of motion with no synovitis.  She has tenderness of several PIP joints.  She has PIP and DIP synovial thickening.  Hip joints, knee joints, ankle joints, MTPs, PIPs, DIPs good range of motion no synovitis.  She has PIP and DIP synovial thickening consistent with osteoarthritis of bilateral feet.  No warmth or effusion of bilateral knees.  Tenderness of trochanteric bursa.  CDAI Exam: CDAI Homunculus Exam:   Joint Counts:  CDAI Tender Joint count: 0 CDAI Swollen Joint count: 0  Global Assessments:  Patient Global Assessment: 6 Provider Global Assessment: 6  CDAI Calculated Score: 12    Investigation: No additional findings.TB Gold: 01/23/2017 negative  CBC Latest Ref Rng & Units 09/13/2017 06/13/2017 03/24/2017  WBC 3.8 - 10.8 Thousand/uL 6.3 4.0 4.8  Hemoglobin 11.7 - 15.5 g/dL 16.1 09.6 04.5  Hematocrit 35.0 - 45.0 % 42.5 43.0 43.5  Platelets 140 - 400 Thousand/uL 231 227 183   CMP Latest Ref Rng & Units 09/13/2017 06/13/2017 03/24/2017  Glucose 65 - 99 mg/dL 89 98 84  BUN 7 - 25 mg/dL 16 7(L) 5(L)  Creatinine 0.50 - 0.99 mg/dL 4.09 8.11(B) 1.47  Sodium 135 - 146 mmol/L 141 141 137  Potassium 3.5 - 5.3 mmol/L 4.9 5.0 4.6  Chloride 98 - 110 mmol/L 105 104 102  CO2 20 - 32 mmol/L Calcium 8.6 - 10.4 mg/dL 9.3 8.9 9.1  Total Protein 6.1 - 8.1  g/dL 6.6 6.3 6.2  Total Bilirubin 0.2 - 1.2 mg/dL 0.7 1.1 8.2(N)  Alkaline Phos 39 - 117 IU/L - 62 57  AST 10 - 35 U/L ALT 6 - 29 U/L Imaging: No results found.  Speciality Comments: No specialty comments available.    Procedures:  No procedures performed Allergies: Patient has no known allergies.   Assessment / Plan:     Visit Diagnoses: Psoriatic arthropathy (HCC): She has no synovitis or dactylitis on exam.  She has PIP and DIP synovial thickening in bilateral hands. No achilles tendonitis, plantar fasciitis, or SI joint tenderness. She has increased PIP joint tenderness and joint stiffness in her hands since being out of Humira for the past 1 month.  She required lab work and a office visit before a refill was sent in.  We will provide a sample of Humira today in the office.  I  am sending in a refill of Humira today.  She is having CBC, CMP, and TB gold drawn today.    Psoriasis: No active psoriasis.   High risk medication use - Humira  -CBC, CMP, TB gold will be drawn today.  She will return in August and every 3 months for CBC and CMP to monitor for drug toxicity.  Plan: CBC with Differential/Platelet, COMPLETE METABOLIC PANEL WITH GFR, QuantiFERON-TB Gold Plus, CBC with Differential/Platelet, COMPLETE METABOLIC PANEL WITH GFR  Primary osteoarthritis of both hands: She has PIP and DIP synovial thickening consistent with osteoarthritis of bilateral hands.  Joint protection and muscle strengthening were discussed.  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  No warmth or effusion noted.   Primary osteoarthritis of both feet - calcaneal spurs: She has PIP and DIP synovial thickening consistent with zoster arthritis of bilateral feet.  She has bunions of first MTP joints bilaterally.  Other fatigue  DDD (degenerative disc disease), lumbar: She has no midline spinal tenderness.  She experiences  lower back stiffness at times.  History of hypertension    Orders: Orders Placed This Encounter  Procedures  . CBC with Differential/Platelet  . COMPLETE METABOLIC PANEL WITH GFR  . QuantiFERON-TB Gold Plus  . CBC with Differential/Platelet  . COMPLETE METABOLIC PANEL WITH GFR   No orders of the defined types were placed in this encounter.   Face-to-face time spent with patient was 30 minutes. >50% of time was spent in counseling and coordination of care.  Follow-Up Instructions: Return in about 5 months (around 07/25/2018) for Psoriatic arthritis, Osteoarthritis.   Gearldine Bienenstock, PA-C   I examined and evaluated the patient with Sherron Ales PA.  Patient had no synovitis on my exam today.  The plan of care was discussed as noted above.  Pollyann Savoy, MD  Note - This record has been created using Animal nutritionist.  Chart creation errors have been sought, but may not always  have been located. Such creation errors do not reflect on  the standard of medical care.

## 2018-02-16 ENCOUNTER — Ambulatory Visit: Payer: 59 | Admitting: Rheumatology

## 2018-02-22 ENCOUNTER — Ambulatory Visit: Payer: 59 | Admitting: Rheumatology

## 2018-02-22 ENCOUNTER — Encounter: Payer: Self-pay | Admitting: Rheumatology

## 2018-02-22 VITALS — BP 142/85 | HR 74 | Resp 14 | Ht 65.0 in | Wt 194.0 lb

## 2018-02-22 DIAGNOSIS — Z8679 Personal history of other diseases of the circulatory system: Secondary | ICD-10-CM | POA: Diagnosis not present

## 2018-02-22 DIAGNOSIS — L4050 Arthropathic psoriasis, unspecified: Secondary | ICD-10-CM

## 2018-02-22 DIAGNOSIS — Z79899 Other long term (current) drug therapy: Secondary | ICD-10-CM | POA: Diagnosis not present

## 2018-02-22 DIAGNOSIS — M170 Bilateral primary osteoarthritis of knee: Secondary | ICD-10-CM | POA: Diagnosis not present

## 2018-02-22 DIAGNOSIS — R5383 Other fatigue: Secondary | ICD-10-CM

## 2018-02-22 DIAGNOSIS — M19071 Primary osteoarthritis, right ankle and foot: Secondary | ICD-10-CM

## 2018-02-22 DIAGNOSIS — M19072 Primary osteoarthritis, left ankle and foot: Secondary | ICD-10-CM

## 2018-02-22 DIAGNOSIS — M5136 Other intervertebral disc degeneration, lumbar region: Secondary | ICD-10-CM

## 2018-02-22 DIAGNOSIS — M19041 Primary osteoarthritis, right hand: Secondary | ICD-10-CM

## 2018-02-22 DIAGNOSIS — L409 Psoriasis, unspecified: Secondary | ICD-10-CM | POA: Diagnosis not present

## 2018-02-22 DIAGNOSIS — M19042 Primary osteoarthritis, left hand: Secondary | ICD-10-CM

## 2018-02-22 MED ORDER — ADALIMUMAB 40 MG/0.4ML ~~LOC~~ AJKT: 40.0000 mg | AUTO-INJECTOR | SUBCUTANEOUS | 0 refills | Status: DC

## 2018-02-22 NOTE — Patient Instructions (Signed)
Standing Labs We placed an order today for your standing lab work.    Please come back and get your standing labs in August and every 3 months   We have open lab Monday through Friday from 8:30-11:30 AM and 1:30-4:00 PM  at the office of Dr. Shaili Deveshwar.   You may experience shorter wait times on Monday and Friday afternoons. The office is located at [address], [address], [place], [place] [address] No appointment is necessary.   Labs are drawn by [facility].  You may receive a bill from [facility] for your lab work. If you have any questions regarding directions or hours of operation,  please call [phone].    

## 2018-02-23 NOTE — Progress Notes (Signed)
Labs are WNL.

## 2018-02-24 LAB — COMPLETE METABOLIC PANEL WITH GFR
AG RATIO: 1.6 (calc) (ref 1.0–2.5)
ALBUMIN MSPROF: 4.1 g/dL (ref 3.6–5.1)
ALKALINE PHOSPHATASE (APISO): 66 U/L (ref 33–130)
ALT: 19 U/L (ref 6–29)
AST: 17 U/L (ref 10–35)
BILIRUBIN TOTAL: 0.7 mg/dL (ref 0.2–1.2)
BUN: 9 mg/dL (ref 7–25)
CHLORIDE: 106 mmol/L (ref 98–110)
CO2: 30 mmol/L (ref 20–32)
Calcium: 9.1 mg/dL (ref 8.6–10.4)
Creat: 0.61 mg/dL (ref 0.50–0.99)
GFR, Est African American: 114 mL/min/{1.73_m2} (ref >=60)
GFR, Est Non African American: 99 mL/min/{1.73_m2} (ref >=60)
GLUCOSE: 87 mg/dL (ref 65–99)
Globulin: 2.5 g/dL (calc) (ref 1.9–3.7)
POTASSIUM: 5.2 mmol/L (ref 3.5–5.3)
SODIUM: 142 mmol/L (ref 135–146)
Total Protein: 6.6 g/dL (ref 6.1–8.1)

## 2018-02-24 LAB — CBC WITH DIFFERENTIAL/PLATELET
BASOS ABS: 57 {cells}/uL (ref 0–200)
Basophils Relative: 1.1 %
EOS ABS: 31 {cells}/uL (ref 15–500)
Eosinophils Relative: 0.6 %
HCT: 43 % (ref 35.0–45.0)
Hemoglobin: 14.5 g/dL (ref 11.7–15.5)
Lymphs Abs: 1804 cells/uL (ref 850–3900)
MCH: 29 pg (ref 27.0–33.0)
MCHC: 33.7 g/dL (ref 32.0–36.0)
MCV: 86 fL (ref 80.0–100.0)
MONOS PCT: 9.4 %
MPV: 10.8 fL (ref 7.5–12.5)
Neutro Abs: 2818 cells/uL (ref 1500–7800)
Neutrophils Relative %: 54.2 %
Platelets: 205 10*3/uL (ref 140–400)
RBC: 5 10*6/uL (ref 3.80–5.10)
RDW: 13.4 % (ref 11.0–15.0)
TOTAL LYMPHOCYTE: 34.7 %
WBC mixed population: 489 cells/uL (ref 200–950)
WBC: 5.2 10*3/uL (ref 3.8–10.8)

## 2018-02-24 LAB — QUANTIFERON-TB GOLD PLUS
Mitogen-NIL: >10 IU/mL
NIL: 0.02 IU/mL
QUANTIFERON-TB GOLD PLUS: NEGATIVE
TB2-NIL: 0 [IU]/mL

## 2018-07-27 ENCOUNTER — Ambulatory Visit: Payer: 59 | Admitting: Rheumatology

## 2019-05-15 NOTE — Progress Notes (Deleted)
Office Visit Note  Patient: Karen Bryant             Date of Birth: June 16, 1956           MRN: [id number]             PCP: Monico Blitz, MD Referring: Monico Blitz, MD Visit Date: 05/28/2019 Occupation: @GUAROCC @  Subjective:  No chief complaint on file.   History of Present Illness: Karen Bryant is a 63 y.o. female ***   Activities of Daily Living:  Patient reports morning stiffness for *** {minute/hour:19697}.   Patient {ACTIONS;DENIES/REPORTS:21021675::"Denies"} nocturnal pain.  Difficulty dressing/grooming: {ACTIONS;DENIES/REPORTS:21021675::"Denies"} Difficulty climbing stairs: {ACTIONS;DENIES/REPORTS:21021675::"Denies"} Difficulty getting out of chair: {ACTIONS;DENIES/REPORTS:21021675::"Denies"} Difficulty using hands for taps, buttons, cutlery, and/or writing: {ACTIONS;DENIES/REPORTS:21021675::"Denies"}  No Rheumatology ROS completed.   PMFS History:  Patient Active Problem List   Diagnosis Date Noted  . High risk medication use 01/20/2017  . 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 Essential hypertension 01/20/2017  . History of depression 01/20/2017  . Psoriatic arthropathy ([place]) 01/16/2017  . Psoriasis 01/16/2017  . Primary osteoarthritis of both feet 01/16/2017  . Primary osteoarthritis of both hands 01/16/2017  . Primary osteoarthritis of both knees 01/16/2017    Past Medical History:  Diagnosis Date  . Psoriatic arthritis ([place])     Family History  Problem Relation Age of Onset  . Alzheimer's disease Mother   . Heart failure Mother   . Parkinson's disease Father   . Cancer Father        prostate  . Cancer Sister        breast cancer   . Healthy Daughter   . Healthy Daughter    Past Surgical History:  Procedure Laterality Date  . TUBAL LIGATION     Social History   Social History Narrative  . Not on file    There is no immunization history on file for this patient.   Objective: Vital Signs: There were no vitals taken for this visit.   Physical  Exam   Musculoskeletal Exam: ***  CDAI Exam: CDAI Score: - Patient Global: -; Provider Global: - Swollen: -; Tender: - Joint Exam   No joint exam has been documented for this visit   There is currently no information documented on the homunculus. Go to the Rheumatology activity and complete the homunculus joint exam.  Investigation: No additional findings.  Imaging: No results found.  Recent Labs: Lab Results  Component Value Date   WBC 5.2 02/22/2018   HGB 14.5 02/22/2018   PLT 205 02/22/2018   NA 142 02/22/2018   K 5.2 02/22/2018   CL 106 02/22/2018   CO2 30 02/22/2018   GLUCOSE 87 02/22/2018   BUN 9 02/22/2018   CREATININE 0.61 02/22/2018   BILITOT 0.7 02/22/2018   ALKPHOS 62 06/13/2017   AST 17 02/22/2018   ALT 19 02/22/2018   PROT 6.6 02/22/2018   ALBUMIN 4.1 06/13/2017   CALCIUM 9.1 02/22/2018   GFRAA 114 02/22/2018   QFTBGOLDPLUS NEGATIVE 02/22/2018    Speciality Comments: No specialty comments available.  Procedures:  No procedures performed Allergies: Patient has no known allergies.   Assessment / Plan:     Visit Diagnoses: No diagnosis found.  Orders: No orders of the defined types were placed in this encounter.  No orders of the defined types were placed in this encounter.   Face-to-face time spent with patient was *** minutes. Greater than 50% of time was spent in counseling and coordination of care.  Follow-Up Instructions:  No follow-ups on file.   Earnestine Mealing, CMA  Note - This record has been created using Editor, commissioning.  Chart creation errors have been sought, but may not always  have been located. Such creation errors do not reflect on  the standard of medical care.

## 2019-05-23 NOTE — Progress Notes (Signed)
Office Visit Note  Patient: Karen MaltaKathy Puett             Date of Birth: August 12, 1956           MRN: [id number]             PCP: Kirstie PeriShah, Ashish, MD Referring: Kirstie PeriShah, Ashish, MD Visit Date: 05/28/2019 Occupation: @GUAROCC @  Subjective:  Left trochanteric bursitis     History of Present Illness: Karen Bryant is a 63 y.o. female with history of psoriatic arthritis, osteoarthritis, and DDD.  Patient has been off of Humira for 1 year due to having to take care of her husband.  She would like to restart on Humira.  She is having increased pain in both hands and both feet.  She denies any Achilles tendinitis or plantar fasciitis.  She denies any SI joint pain.  She is been experiencing left trochanteric bursitis over the past several months.  She has difficulty laying on her side at night.  She denies any groin pain.  She denies any active psoriasis.  She has been experiencing increased morning stiffness lasting up to 2 hours.  Activities of Daily Living:  Patient reports morning stiffness for 2  hours.   Patient Reports nocturnal pain.  Difficulty dressing/grooming: Denies Difficulty climbing stairs: Denies Difficulty getting out of chair: Denies Difficulty using hands for taps, buttons, cutlery, and/or writing: Denies  Review of Systems  Constitutional: Positive for fatigue.  HENT: Negative for mouth sores, mouth dryness and nose dryness.   Eyes: Negative for pain, visual disturbance and dryness.  Respiratory: Negative for cough, hemoptysis, shortness of breath and difficulty breathing.   Cardiovascular: Negative for chest pain, palpitations, hypertension and swelling in legs/feet.  Gastrointestinal: Negative for blood in stool, constipation and diarrhea.  Endocrine: Negative for increased urination.  Genitourinary: Negative for painful urination.  Musculoskeletal: Positive for arthralgias, joint pain, joint swelling and morning stiffness. Negative for myalgias, muscle weakness, muscle  tenderness and myalgias.  Skin: Negative for color change, pallor, rash, hair loss, nodules/bumps, skin tightness, ulcers and sensitivity to sunlight.  Allergic/Immunologic: Negative for susceptible to infections.  Neurological: Negative for dizziness, numbness, headaches and weakness.  Hematological: Negative for swollen glands.  Psychiatric/Behavioral: Negative for depressed mood and sleep disturbance. The patient is not nervous/anxious.     PMFS History:  Patient Active Problem List   Diagnosis Date Noted  . High risk medication use 01/20/2017  . 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 Essential hypertension 01/20/2017  . History of depression 01/20/2017  . Psoriatic arthropathy (HCC) 01/16/2017  . Psoriasis 01/16/2017  . Primary osteoarthritis of both feet 01/16/2017  . Primary osteoarthritis of both hands 01/16/2017  . Primary osteoarthritis of both knees 01/16/2017    Past Medical History:  Diagnosis Date  . Psoriatic arthritis (HCC)     Family History  Problem Relation Age of Onset  . Alzheimer's disease Mother   . Heart failure Mother   . Parkinson's disease Father   . Cancer Father        prostate  . Cancer Sister        breast cancer   . Healthy Daughter   . Healthy Daughter    Past Surgical History:  Procedure Laterality Date  . TUBAL LIGATION     Social History   Social History Narrative  . Not on file    There is no immunization history on file for this patient.   Objective: Vital Signs: BP 138/83 (BP Location: Left Arm, Patient Position: Sitting,  Cuff Size: Normal)   Pulse 72   Resp 13   Ht 5\' 5"  (1.651 m)   Wt 208 lb 12.8 oz (94.7 kg)   BMI 34.75 kg/m    Physical Exam Vitals signs and nursing note reviewed.  Constitutional:      Appearance: She is well-developed.  HENT:     Head: Normocephalic and atraumatic.  Eyes:     Conjunctiva/sclera: Conjunctivae normal.  Neck:     Musculoskeletal: Normal range of motion.  Cardiovascular:     Rate and  Rhythm: Normal rate and regular rhythm.     Heart sounds: Normal heart sounds.  Pulmonary:     Effort: Pulmonary effort is normal.     Breath sounds: Normal breath sounds.  Abdominal:     General: Bowel sounds are normal.     Palpations: Abdomen is soft.  Lymphadenopathy:     Cervical: No cervical adenopathy.  Skin:    General: Skin is warm and dry.     Capillary Refill: Capillary refill takes less than 2 seconds.  Neurological:     Mental Status: She is alert and oriented to person, place, and time.  Psychiatric:        Behavior: Behavior normal.      Musculoskeletal Exam: C-spine, thoracic spine, and lumbar spine good ROM.  No midline spinal tenderness.  No SI joint tenderness.  Shoulder joints, elbow joints, wrist joints, MCPs, PIPs ,and DIPs good ROM with no synovitis.  Complete fist formation bilaterally.  PIP and DIP synovial thickening consistent with osteoarthritis of both hands. Right medial epicondylitis.   Hip joints, knee joints, ankle joints, MTPs, PIPs, and DIPs good ROM with no synovitis.  No warmth or effusion of knee joints.  No tenderness or swelling of ankle joints.  No achilles tendonitis or plantar fasciitis.  Tenderness over the left trochanteric bursa.   CDAI Exam: CDAI Score: 2.6  Patient Global: 3 mm; Provider Global: 3 mm Swollen: 0 ; Tender: 2  Joint Exam      Right  Left  MCP 2   Tender     MCP 3   Tender        Investigation: No additional findings.  Imaging: No results found.  Recent Labs: Lab Results  Component Value Date   WBC 5.2 02/22/2018   HGB 14.5 02/22/2018   PLT 205 02/22/2018   NA 142 02/22/2018   K 5.2 02/22/2018   CL 106 02/22/2018   CO2 30 02/22/2018   GLUCOSE 87 02/22/2018   BUN 9 02/22/2018   CREATININE 0.61 02/22/2018   BILITOT 0.7 02/22/2018   ALKPHOS 62 06/13/2017   AST 17 02/22/2018   ALT 19 02/22/2018   PROT 6.6 02/22/2018   ALBUMIN 4.1 06/13/2017   CALCIUM 9.1 02/22/2018   GFRAA 114 02/22/2018    QFTBGOLDPLUS NEGATIVE 02/22/2018    Speciality Comments: No specialty comments available.  Procedures:  No procedures performed Allergies: Patient has no known allergies.      Assessment / Plan:     Visit Diagnoses: Psoriatic arthropathy ([place]) -She has no synovitis or dactylitis on exam.  She presents today with increased pain in both hands and both feet.  She has PIP and DIP synovial thickening consistent with osteoarthritis of both hands and both feet.  She has tenderness of the right 2nd and 3rd MCP joints.  She has been experiencing increased morning stiffness lasting up to 2 hours.  She has no achilles tendonitis or plantar fasciitis or achilles tendonitis.  She has no SI joint tenderness.  She has no psoriasis at this time.  She has been off of Humira for over 1 year due to having to act as a caregiver for her husband.  She would like to restart on Humira at this time.  Indications, contraindications, and potential side effects of Humira were discussed.  All questions were addressed and consent was obtained.  We will obtain CBC, CMP, and TB gold prior to scheduling a nurse visit for administration of the injection.  She was advised to notify us if she develops increased joint pain or joint swelling.  She will follow up in 3 months.   Psoriasis - She has no active psoriasis at this time.   High risk medication use - Humira 40 mg every 14 days and last fill on 02/22/2018  Last TB gold negative on 02/22/2018.  Due for TB gold today and will monitor yearly.  Most recent CBC/CMP within normal limits on 02/22/2018.  She is overdue for labs.  CBC/CMP ordered for today will monitor every 3 months.  Standing orders are in place.  I discussed the importance of holding Humira if she develops any signs or symptoms of an infection and to resume once infection has completely cleared.  I also discussed the importance of having yearly skin exams while on Humira due to the increased risk of skin cancer such as  melanoma.  - Plan: CBC with Differential/Platelet, COMPLETE METABOLIC PANEL WITH GFR, QuantiFERON-TB Gold Plus  Medial epicondylitis of elbow, right: She has tenderness over the right medial epicondyle.  We discussed using voltaren gel topically as needed for pain relief.   Trochanteric bursitis, left hip -She presents today with tenderness over the left trochanteric bursa.  She has difficulty sleeping on her left side at night. She declined a cortisone injection today.  Exercises were demonstrated in the office.  She was also given a handout of stretching exercises to perform.  She can apply voltaren gel topically as needed for pain relief.  She will notify us if her symptoms are persistent or worsening.   Primary osteoarthritis of both hands - She has PIP and DIP synovial thickening consistent with osteoarthritis of both hands.  She has complete fist formation bilaterally. She has been experiencing increased morning stiffness. Joint protection and muscle strengthening were discussed.    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  She has good ROM of both knee joints.  She has bilateral knee crepitus.  No warmth or effusion of knee joints.    Primary osteoarthritis of both feet - calcaneal spurs - She has PIP and DIP synovial thickening consistent with osteoarthritis of both feet.  She has thickening of bilateral first MTP joints.  She has pain and stiffness in both feet, especially in the morning.   DDD (degenerative disc disease), lumbar - She has no lower back pain.  No midline spinal tenderness.  She has good ROM with no discomfort.   Other fatigue - She has chronic fatigue but it has been stable.   History of hypertension - BP was 138/83 today in the office.   Orders: Orders Placed This Encounter  Procedures  . CBC with Differential/Platelet  . COMPLETE METABOLIC PANEL WITH GFR  . QuantiFERON-TB Gold Plus    No orders of the defined types were placed in this encounter.   Face-to-face time spent with patient was 30 minutes. Greater than  50% of time was spent in counseling and coordination of care.  Follow-Up Instructions: Return in about 3 months (around 08/28/2019) for Psoriatic arthritis, Osteoarthritis, DDD.   Gearldine Bienenstockaylor M Marlaysia Lenig, PA-C  Note - This record has been created using Dragon software.  Chart creation errors have been sought, but may not always  have been located. Such creation errors do not reflect on  the standard of medical care.

## 2019-05-28 ENCOUNTER — Ambulatory Visit: Payer: Self-pay | Admitting: Physician Assistant

## 2019-05-28 ENCOUNTER — Other Ambulatory Visit: Payer: Self-pay

## 2019-05-28 ENCOUNTER — Encounter: Payer: Self-pay | Admitting: Physician Assistant

## 2019-05-28 ENCOUNTER — Ambulatory Visit: Payer: Self-pay | Admitting: Rheumatology

## 2019-05-28 ENCOUNTER — Telehealth: Payer: Self-pay

## 2019-05-28 VITALS — BP 138/83 | HR 72 | Resp 13 | Ht 65.0 in | Wt 208.8 lb

## 2019-05-28 DIAGNOSIS — M170 Bilateral primary osteoarthritis of knee: Secondary | ICD-10-CM

## 2019-05-28 DIAGNOSIS — M7701 Medial epicondylitis, right elbow: Secondary | ICD-10-CM

## 2019-05-28 DIAGNOSIS — R5383 Other fatigue: Secondary | ICD-10-CM

## 2019-05-28 DIAGNOSIS — L4050 Arthropathic psoriasis, unspecified: Secondary | ICD-10-CM | POA: Diagnosis not present

## 2019-05-28 DIAGNOSIS — M19041 Primary osteoarthritis, right hand: Secondary | ICD-10-CM

## 2019-05-28 DIAGNOSIS — Z79899 Other long term (current) drug therapy: Secondary | ICD-10-CM

## 2019-05-28 DIAGNOSIS — L409 Psoriasis, unspecified: Secondary | ICD-10-CM

## 2019-05-28 DIAGNOSIS — M51369 Other intervertebral disc degeneration, lumbar region without mention of lumbar back pain or lower extremity pain: Secondary | ICD-10-CM

## 2019-05-28 DIAGNOSIS — M19072 Primary osteoarthritis, left ankle and foot: Secondary | ICD-10-CM

## 2019-05-28 DIAGNOSIS — M19071 Primary osteoarthritis, right ankle and foot: Secondary | ICD-10-CM

## 2019-05-28 DIAGNOSIS — M5136 Other intervertebral disc degeneration, lumbar region: Secondary | ICD-10-CM

## 2019-05-28 DIAGNOSIS — M19042 Primary osteoarthritis, left hand: Secondary | ICD-10-CM

## 2019-05-28 DIAGNOSIS — Z8679 Personal history of other diseases of the circulatory system: Secondary | ICD-10-CM

## 2019-05-28 DIAGNOSIS — M7062 Trochanteric bursitis, left hip: Secondary | ICD-10-CM | POA: Diagnosis not present

## 2019-05-28 NOTE — Patient Instructions (Signed)
Voltaren gel    Iliotibial Band Syndrome Rehab Ask your health care provider which exercises are safe for you. Do exercises exactly as told by your health care provider and adjust them as directed. It is normal to feel mild stretching, pulling, tightness, or discomfort as you do these exercises. Stop right away if you feel sudden pain or your pain gets significantly worse. Do not begin these exercises until told by your health care provider. Stretching and range-of-motion exercises These exercises warm up your muscles and joints and improve the movement and flexibility of your hip and pelvis. Quadriceps stretch, prone  1. Lie on your abdomen on a firm surface, such as a bed or padded floor (prone position). 2. Bend your left / right knee and reach back to hold your ankle or pant leg. If you cannot reach your ankle or pant leg, loop a belt around your foot and grab the belt instead. 3. Gently pull your heel toward your buttocks. Your knee should not slide out to the side. You should feel a stretch in the front of your thigh and knee (quadriceps). 4. Hold this position for __________ seconds. Repeat __________ times. Complete this exercise __________ times a day. Iliotibial band stretch An iliotibial band is a strong band of muscle tissue that runs from the outer side of your hip to the outer side of your thigh and knee. 1. Lie on your side with your left / right leg in the top position. 2. Bend both of your knees and grab your left / right ankle. Stretch out your bottom arm to help you balance. 3. Slowly bring your top knee back so your thigh goes behind your trunk. 4. Slowly lower your top leg toward the floor until you feel a gentle stretch on the outside of your left / right hip and thigh. If you do not feel a stretch and your knee will not fall farther, place the heel of your other foot on top of your knee and pull your knee down toward the floor with your foot. 5. Hold this position for  __________ seconds. Repeat __________ times. Complete this exercise __________ times a day. Strengthening exercises These exercises build strength and endurance in your hip and pelvis. Endurance is the ability to use your muscles for a long time, even after they get tired. Straight leg raises, side-lying This exercise strengthens the muscles that rotate the leg at the hip and move it away from your body (hip abductors). 1. Lie on your side with your left / right leg in the top position. Lie so your head, shoulder, hip, and knee line up. You may bend your bottom knee to help you balance. 2. Roll your hips slightly forward so your hips are stacked directly over each other and your left / right knee is facing forward. 3. Tense the muscles in your outer thigh and lift your top leg 4-6 inches (10-15 cm). 4. Hold this position for __________ seconds. 5. Slowly return to the starting position. Let your muscles relax completely before doing another repetition. Repeat __________ times. Complete this exercise __________ times a day. Leg raises, prone This exercise strengthens the muscles that move the hips (hip extensors). 1. Lie on your abdomen on your bed or a firm surface. You can put a pillow under your hips if that is more comfortable for your lower back. 2. Bend your left / right knee so your foot is straight up in the air. 3. Squeeze your buttocks muscles and lift  your left / right thigh off the bed. Do not let your back arch. 4. Tense your thigh muscle as hard as you can without increasing any knee pain. 5. Hold this position for __________ seconds. 6. Slowly lower your leg to the starting position and allow it to relax completely. Repeat __________ times. Complete this exercise __________ times a day. Hip hike 1. Stand sideways on a bottom step. Stand on your left / right leg with your other foot unsupported next to the step. You can hold on to the railing or wall for balance if needed. 2. Keep  your knees straight and your torso square. Then lift your left / right hip up toward the ceiling. 3. Slowly let your left / right hip lower toward the floor, past the starting position. Your foot should get closer to the floor. Do not lean or bend your knees. Repeat __________ times. Complete this exercise __________ times a day. This information is not intended to replace advice given to you by your health care provider. Make sure you discuss any questions you have with your health care provider. Document Released: 09/26/2005 Document Revised: 01/17/2019 Document Reviewed: 07/18/2018 Elsevier Patient Education  2020 [employer]  Hip Bursitis Rehab Ask your health care provider which exercises are safe for you. Do exercises exactly as told by your health care provider and adjust them as directed. It is normal to feel mild stretching, pulling, tightness, or discomfort as you do these exercises. Stop right away if you feel sudden pain or your pain gets worse. Do not begin these exercises until told by your health care provider. Stretching exercise This exercise warms up your muscles and joints and improves the movement and flexibility of your hip. This exercise also helps to relieve pain and stiffness. Iliotibial band stretch An iliotibial band is a strong band of muscle tissue that runs from the outer side of your hip to the outer side of your thigh and knee. 1. Lie on your side with your left / right leg in the top position. 2. Bend your left / right knee and grab your ankle. Stretch out your bottom arm to help you balance. 3. Slowly bring your knee back so your thigh is behind your body. 4. Slowly lower your knee toward the floor until you feel a gentle stretch on the outside of your left / right thigh. If you do not feel a stretch and your knee will not fall farther, place the heel of your other foot on top of your knee and pull your knee down toward the floor with your foot. 5. Hold this  position for __________ seconds. 6. Slowly return to the starting position. Repeat __________ times. Complete this exercise __________ times a day. Strengthening exercises These exercises build strength and endurance in your hip and pelvis. Endurance is the ability to use your muscles for a long time, even after they get tired. Bridge This exercise strengthens the muscles that move your thigh backward (hip extensors). 1. Lie on your back on a firm surface with your knees bent and your feet flat on the floor. 2. Tighten your buttocks muscles and lift your buttocks off the floor until your trunk is level with your thighs. ? Do not arch your back. ? You should feel the muscles working in your buttocks and the back of your thighs. If you do not feel these muscles, slide your feet 1-2 inches (2.5-5 cm) farther away from your buttocks. ? If this exercise is too easy,  try doing it with your arms crossed over your chest. 3. Hold this position for __________ seconds. 4. Slowly lower your hips to the starting position. 5. Let your muscles relax completely after each repetition. Repeat __________ times. Complete this exercise __________ times a day. Squats This exercise strengthens the muscles in front of your thigh and knee (quadriceps). 1. Stand in front of a table, with your feet and knees pointing straight ahead. You may rest your hands on the table for balance but not for support. 2. Slowly bend your knees and lower your hips like you are going to sit in a chair. ? Keep your weight over your heels, not over your toes. ? Keep your lower legs upright so they are parallel with the table legs. ? Do not let your hips go lower than your knees. ? Do not bend lower than told by your health care provider. ? If your hip pain increases, do not bend as low. 3. Hold the squat position for __________ seconds. 4. Slowly push with your legs to return to standing. Do not use your hands to pull yourself to  standing. Repeat __________ times. Complete this exercise __________ times a day. Hip hike 1. Stand sideways on a bottom step. Stand on your left / right leg with your other foot unsupported next to the step. You can hold on to the railing or wall for balance if needed. 2. Keep your knees straight and your torso square. Then lift your left / right hip up toward the ceiling. 3. Hold this position for __________ seconds. 4. Slowly let your left / right hip lower toward the floor, past the starting position. Your foot should get closer to the floor. Do not lean or bend your knees. Repeat __________ times. Complete this exercise __________ times a day. Single leg stand 1. Without shoes, stand near a railing or in a doorway. You may hold on to the railing or door frame as needed for balance. 2. Squeeze your left / right buttock muscles, then lift up your other foot. ? Do not let your left / right hip push out to the side. ? It is helpful to stand in front of a mirror for this exercise so you can watch your hip. 3. Hold this position for __________ seconds. Repeat __________ times. Complete this exercise __________ times a day. This information is not intended to replace advice given to you by your health care provider. Make sure you discuss any questions you have with your health care provider. Document Released: 11/03/2004 Document Revised: 01/21/2019 Document Reviewed: 01/21/2019 Elsevier Patient Education  2020 [employer]

## 2019-05-28 NOTE — Telephone Encounter (Signed)
Patient was in the office today for an appointment and labs. After lab results are available, please schedule a nurse visit to restart Humira, per Hazel Sams, PA-C. Thanks!

## 2019-05-29 NOTE — Progress Notes (Signed)
RBC count and Hct are borderline elevated.  Rest of CBC WNL.  CMP WNL.

## 2019-05-30 LAB — CBC WITH DIFFERENTIAL/PLATELET
Absolute Monocytes: 319 cells/uL (ref 200–950)
Basophils Absolute: 49 cells/uL (ref 0–200)
Basophils Relative: 1.3 %
Eosinophils Absolute: 49 cells/uL (ref 15–500)
Eosinophils Relative: 1.3 %
HCT: 45.1 % — ABNORMAL HIGH (ref 35.0–45.0)
Hemoglobin: 14.9 g/dL (ref 11.7–15.5)
Lymphs Abs: 1345 cells/uL (ref 850–3900)
MCH: 29.1 pg (ref 27.0–33.0)
MCHC: 33 g/dL (ref 32.0–36.0)
MCV: 88.1 fL (ref 80.0–100.0)
MPV: 10.9 fL (ref 7.5–12.5)
Monocytes Relative: 8.4 %
Neutro Abs: 2037 cells/uL (ref 1500–7800)
Neutrophils Relative %: 53.6 %
Platelets: 215 10*3/uL (ref 140–400)
RBC: 5.12 10*6/uL — ABNORMAL HIGH (ref 3.80–5.10)
RDW: 13.6 % (ref 11.0–15.0)
Total Lymphocyte: 35.4 %
WBC: 3.8 10*3/uL (ref 3.8–10.8)

## 2019-05-30 LAB — COMPLETE METABOLIC PANEL WITH GFR
AG Ratio: 1.7 (calc) (ref 1.0–2.5)
ALT: 13 U/L (ref 6–29)
AST: 13 U/L (ref 10–35)
Albumin: 4.2 g/dL (ref 3.6–5.1)
Alkaline phosphatase (APISO): 67 U/L (ref 37–153)
BUN: 13 mg/dL (ref 7–25)
CO2: 28 mmol/L (ref 20–32)
Calcium: 9.6 mg/dL (ref 8.6–10.4)
Chloride: 105 mmol/L (ref 98–110)
Creat: 0.82 mg/dL (ref 0.50–0.99)
GFR, Est African American: 89 mL/min/{1.73_m2} (ref >=60)
GFR, Est Non African American: 77 mL/min/{1.73_m2} (ref >=60)
Globulin: 2.5 g/dL (calc) (ref 1.9–3.7)
Glucose, Bld: 99 mg/dL (ref 65–99)
Potassium: 4.8 mmol/L (ref 3.5–5.3)
Sodium: 140 mmol/L (ref 135–146)
Total Bilirubin: 1.1 mg/dL (ref 0.2–1.2)
Total Protein: 6.7 g/dL (ref 6.1–8.1)

## 2019-05-30 LAB — QUANTIFERON-TB GOLD PLUS
Mitogen-NIL: >10 IU/mL
NIL: 0.02 IU/mL
QuantiFERON-TB Gold Plus: NEGATIVE
TB1-NIL: 0 IU/mL
TB2-NIL: 0 IU/mL

## 2019-05-30 NOTE — Telephone Encounter (Signed)
Patient has scheduled for 06/03/19 at 9 am.

## 2019-05-30 NOTE — Telephone Encounter (Signed)
Awaiting TB Gold results.  

## 2019-05-30 NOTE — Progress Notes (Signed)
TB gold negative

## 2019-06-04 ENCOUNTER — Ambulatory Visit (INDEPENDENT_AMBULATORY_CARE_PROVIDER_SITE_OTHER): Payer: BC Managed Care – PPO | Admitting: Pharmacist

## 2019-06-04 ENCOUNTER — Telehealth: Payer: Self-pay | Admitting: Pharmacy Technician

## 2019-06-04 ENCOUNTER — Other Ambulatory Visit: Payer: Self-pay

## 2019-06-04 VITALS — BP 122/82 | HR 75 | Resp 16 | Ht 65.0 in | Wt 206.6 lb

## 2019-06-04 DIAGNOSIS — E559 Vitamin D deficiency, unspecified: Secondary | ICD-10-CM | POA: Diagnosis not present

## 2019-06-04 DIAGNOSIS — L409 Psoriasis, unspecified: Secondary | ICD-10-CM | POA: Diagnosis not present

## 2019-06-04 DIAGNOSIS — L4050 Arthropathic psoriasis, unspecified: Secondary | ICD-10-CM

## 2019-06-04 DIAGNOSIS — R5383 Other fatigue: Secondary | ICD-10-CM

## 2019-06-04 MED ORDER — HUMIRA (2 PEN) 40 MG/0.4ML ~~LOC~~ AJKT: 40.0000 mg | AUTO-INJECTOR | SUBCUTANEOUS | 0 refills | Status: DC

## 2019-06-04 NOTE — Progress Notes (Signed)
Pharmacy Note Subjective: Patient presents today to the [facility] to see Dr. Estanislado Pandy.   Patient seen by the pharmacist for counseling on Humira for psoriatic arthritis.  She has been on Humira in the past and is resuming after gap greater than 3 months. She was previously on Enbrel in the past but was discontinued due to insurance coverage.   Objective:  CBC    Component Value Date/Time   WBC 3.8 05/28/2019 0933   RBC 5.12 (H) 05/28/2019 0933   HGB 14.9 05/28/2019 0933   HGB 13.8 06/13/2017 1101   HCT 45.1 (H) 05/28/2019 0933   HCT 43.0 06/13/2017 1101   PLT 215 05/28/2019 0933   PLT 227 06/13/2017 1101   MCV 88.1 05/28/2019 0933   MCV 90 06/13/2017 1101   MCH 29.1 05/28/2019 0933   MCHC 33.0 05/28/2019 0933   RDW 13.6 05/28/2019 0933   RDW 15.2 06/13/2017 1101   LYMPHSABS 1,345 05/28/2019 0933   LYMPHSABS 1.6 06/13/2017 1101   MONOABS 480 03/24/2017 1211   EOSABS 49 05/28/2019 0933   EOSABS 0.0 06/13/2017 1101   BASOSABS 49 05/28/2019 0933   BASOSABS 0.0 06/13/2017 1101     CMP     Component Value Date/Time   NA 140 05/28/2019 0933   NA 141 06/13/2017 1101   K 4.8 05/28/2019 0933   CL 105 05/28/2019 0933   CO2 28 05/28/2019 0933   GLUCOSE 99 05/28/2019 0933   BUN 13 05/28/2019 0933   BUN 7 (L) 06/13/2017 1101   CREATININE 0.82 05/28/2019 0933   CALCIUM 9.6 05/28/2019 0933   PROT 6.7 05/28/2019 0933   PROT 6.3 06/13/2017 1101   ALBUMIN 4.1 06/13/2017 1101   AST 13 05/28/2019 0933   ALT 13 05/28/2019 0933   ALKPHOS 62 06/13/2017 1101   BILITOT 1.1 05/28/2019 0933   BILITOT 1.1 06/13/2017 1101   GFRNONAA 77 05/28/2019 0933   GFRAA 89 05/28/2019 0933      Baseline Immunosuppressant Therapy Labs TB GOLD Quantiferon TB Gold Latest Ref Rng & Units 05/28/2019  Quantiferon TB Gold Plus NEGATIVE NEGATIVE   Hepatitis Panel: negative 04/16/2014  HIV: negative 04/16/2014  Immunoglobulins: within normal limits 04/16/2014  SPEP: within normal limits  04/16/2014  G6PD No results found for: G6PDH TPMT No results found for: TPMT   Chest x-ray: no active cardiopulmonary disease 04/16/2014  Does patient have diagnosis of heart failure?  No  Assessment/Plan:  Counseled patient that Humira is a TNF blocking agent.  Counseled patient on purpose, proper use, and adverse effects of Humira.  Reviewed the most common adverse effects including infections, headache, and injection site reactions. Discussed that there is the possibility of an increased risk of malignancy but it is not well understood if this increased risk is due to the medication or the disease state.  Advised patient to get yearly dermatology exams due to risk of skin cancer. Counseled patient that Humira should be held prior to scheduled surgery.  Counseled patient to avoid live vaccines while on Humira.  Advised patient to get annual influenza vaccine and the pneumococcal vaccine as indicated.    Reviewed the importance of regular labs while on Humira therapy.  Standing orders placed.    Reviewed storage instructions of Humira.  Advised initial injection must be administered in office.  Patient verbalized understanding.  Demonstrated proper injection technique with Humira demo pen.  Patient able to demonstrate proper injection technique using the teach back method.  Patient self injected in the  left anterior thigh with:  Sample Medication: Humira 40mg /0.504ml citrate free pen NDC: [id number] Lot: [id number] Expiration: 04/2020  Patient tolerated well.  Observed for 30 mins in office for adverse reaction and none noted.  Dose will be for psoriatic arthritis Humira 40 mg every 14 days.  Prescription insurance approval.  She is willing to fill with [facility] as long as they mail her medication and she is not locked into a specific pharmacy with her insurance.  She was given a co-pay card and called to activate.  She was informed that her co-pay card was still active and does not expire.   She was given the information for her co-pay card to provide to the pharmacy once approved.  She also had questions about vitamin D.  Our office prescribed vitamin D 50,0000 units weekly in the past and she wanted to know if she should continue.  Last vitamin D level 13 in 09/13/2017 and she was to return in 3 months for repeat labs.  Instructed patient to take vitamin D 2,000 units daily and we will check her vitamin D level in 1 month when she is due for her CBC/CMP.  Patient verbalized understanding.  All questions encouraged and answered.  Instructed patient to call with any questions or concerns.  Verlin FesterAmber Teyton Pattillo, PharmD, [place]ACP, CPP Clinical Specialty Pharmacist [phone]  06/04/2019 9:51 AM

## 2019-06-04 NOTE — Patient Instructions (Addendum)
Start taking vitamin D 2,000 units daily.  We will check you vitamin D level with next labs.  Standing Labs We placed an order today for your standing lab work.    Please come back and get your standing labs in 1 month and then every 3 months.  We have open lab daily Monday through Thursday from 8:30-12:30 PM and 1:30-4:30 PM and Friday from 8:30-12:30 PM and 1:30 -4:00 PM at the office of Dr. Bo Merino.   You may experience shorter wait times on Monday and Friday afternoons. The office is located at [address], [place], [place], [place] [address] No appointment is necessary.   Labs are drawn by [facility].  You may receive a bill from [facility] for your lab work.  If you wish to have your labs drawn at another location, please call the office 24 hours in advance to send orders.  If you have any questions regarding directions or hours of operation,  please call [phone].   Just as a reminder please drink plenty of water prior to coming for your lab work. Thanks!  Vaccines You are taking a medication(s) that can suppress your immune system.  The following immunizations are recommended: . Flu annually . Pneumonia (Pneumovax 23 and Prevnar 13 spaced at least 1 year apart) . Shingrix  Please check with your PCP to make sure you are up to date.   In addition to staying up to date on lab work and vaccines it is important to:  Marland Kitchen Yearly skin checks with dermatologist due to increase risk in skin cancer with TNF inhibitors .

## 2019-06-04 NOTE — Telephone Encounter (Signed)
Submitted a Prior Authorization request to [facility] for HUMIRA via Cover My Meds. Will update once we receive a response.  9:03 AM Beatriz Chancellor, CPhT

## 2019-06-06 NOTE — Telephone Encounter (Signed)
Received notification from [facility] regarding a prior authorization for Verdel. Authorization has been APPROVED from 06/04/2019 to 80/24/2023.   Will send document to scan center.  Authorization # [id number] Phone # [phone]  Please run a test claim to determine if she must fill with a specific specialty pharmacy per insurance.  Thank you.   Mariella Saa, PharmD, [place], [place] Clinical Specialty Pharmacist [phone]  06/06/2019 10:01 AM

## 2019-06-07 NOTE — Telephone Encounter (Signed)
Ran test claim, patient has a $5.00. Patient can fill at any [place] contracted pharmacy, including [facility]. Patient previously filled through [facility].  11:31 AM Beatriz Chancellor, CPhT

## 2019-06-11 MED ORDER — HUMIRA (2 PEN) 40 MG/0.4ML ~~LOC~~ AJKT: 40.0000 mg | AUTO-INJECTOR | SUBCUTANEOUS | 0 refills | Status: DC

## 2019-06-11 NOTE — Telephone Encounter (Signed)
Notified patient of approval.  She is interested in filling at [facility].  She has her co-pay card information to provide to the pharmacy. Informed that someone from the pharmacy will be reaching out to set up her first shipment.  Her next dose will be due 9/8.    She also wanted to let me know that she remembered she has taken methotrexate in the past.  Will add to her list of prior therapy.  All questions encouraged and answered.  Instructed patient to call with any other questions or concerns.  Mariella Saa, PharmD, [place], [place] Clinical Specialty Pharmacist [phone]  06/11/2019 1:46 PM

## 2019-06-11 NOTE — Telephone Encounter (Signed)
Patient's 1st shipment from [facility] is scheduled to mail out on 9/3 to deliver 9/4 to patient's home.

## 2019-06-13 MED FILL — HUMIRA PEN 40 MG/0.4ML PNKT: 40 | 28 days supply | Qty: 2 | Fill #0

## 2019-06-18 ENCOUNTER — Telehealth: Payer: Self-pay | Admitting: Rheumatology

## 2019-06-18 NOTE — Telephone Encounter (Signed)
Attempted to contact the patient and left message for patient to call the office. Patient will need a one month follow up with Hazel Sams, PA-C, from starting Enbrel as well as labs around 07/05/19.

## 2019-06-18 NOTE — Telephone Encounter (Signed)
Patient left a voicemail requesting a return call to let her know when she is due for bloodwork.

## 2019-07-04 ENCOUNTER — Other Ambulatory Visit: Payer: Self-pay

## 2019-07-04 DIAGNOSIS — Z79899 Other long term (current) drug therapy: Secondary | ICD-10-CM

## 2019-07-04 DIAGNOSIS — R5383 Other fatigue: Secondary | ICD-10-CM

## 2019-07-04 DIAGNOSIS — E559 Vitamin D deficiency, unspecified: Secondary | ICD-10-CM

## 2019-07-04 LAB — CBC WITH DIFFERENTIAL/PLATELET
Absolute Monocytes: 413 cells/uL (ref 200–950)
Basophils Absolute: 83 cells/uL (ref 0–200)
Basophils Relative: 1.5 %
Eosinophils Absolute: 50 cells/uL (ref 15–500)
Eosinophils Relative: 0.9 %
HCT: 45.3 % — ABNORMAL HIGH (ref 35.0–45.0)
Hemoglobin: 14.8 g/dL (ref 11.7–15.5)
Lymphs Abs: 2030 cells/uL (ref 850–3900)
MCH: 29.1 pg (ref 27.0–33.0)
MCHC: 32.7 g/dL (ref 32.0–36.0)
MCV: 89 fL (ref 80.0–100.0)
MPV: 11.9 fL (ref 7.5–12.5)
Monocytes Relative: 7.5 %
Neutro Abs: 2926 cells/uL (ref 1500–7800)
Neutrophils Relative %: 53.2 %
Platelets: 224 10*3/uL (ref 140–400)
RBC: 5.09 10*6/uL (ref 3.80–5.10)
RDW: 13.7 % (ref 11.0–15.0)
Total Lymphocyte: 36.9 %
WBC: 5.5 10*3/uL (ref 3.8–10.8)

## 2019-07-04 LAB — COMPLETE METABOLIC PANEL WITH GFR
AG Ratio: 2 (calc) (ref 1.0–2.5)
ALT: 15 U/L (ref 6–29)
AST: 15 U/L (ref 10–35)
Albumin: 4.2 g/dL (ref 3.6–5.1)
Alkaline phosphatase (APISO): 74 U/L (ref 37–153)
BUN: 13 mg/dL (ref 7–25)
CO2: 27 mmol/L (ref 20–32)
Calcium: 9.5 mg/dL (ref 8.6–10.4)
Chloride: 105 mmol/L (ref 98–110)
Creat: 0.92 mg/dL (ref 0.50–0.99)
GFR, Est African American: 77 mL/min/{1.73_m2} (ref >=60)
GFR, Est Non African American: 67 mL/min/{1.73_m2} (ref >=60)
Globulin: 2.1 g/dL (calc) (ref 1.9–3.7)
Glucose, Bld: 72 mg/dL (ref 65–99)
Potassium: 4.6 mmol/L (ref 3.5–5.3)
Sodium: 142 mmol/L (ref 135–146)
Total Bilirubin: 0.8 mg/dL (ref 0.2–1.2)
Total Protein: 6.3 g/dL (ref 6.1–8.1)

## 2019-07-04 LAB — VITAMIN D 25 HYDROXY (VIT D DEFICIENCY, FRACTURES): Vit D, 25-Hydroxy: 16 ng/mL — ABNORMAL LOW (ref 30–100)

## 2019-07-05 ENCOUNTER — Telehealth: Payer: Self-pay | Admitting: *Deleted

## 2019-07-05 MED ORDER — VITAMIN D (ERGOCALCIFEROL) 1.25 MG (50000 UNIT) PO CAPS: 50000.0000 [IU] | ORAL_CAPSULE | ORAL | 0 refills | Status: DC

## 2019-07-05 NOTE — Progress Notes (Signed)
CMP WNL.  Hct is borderline elevated.  Rest of CBC WNL.

## 2019-07-05 NOTE — Telephone Encounter (Signed)
-----   Message from Ofilia Neas, PA-C sent at 07/05/2019  8:08 AM EDT ----- Vitamin D is low-16.  Please notify patient and send in vitamin D 50,000 units by mouth twice weekly for 3 months.  Recheck in 3 months.

## 2019-07-10 MED FILL — HUMIRA PEN 40 MG/0.4ML PNKT: 40 | 28 days supply | Qty: 2 | Fill #1

## 2019-08-13 MED FILL — HUMIRA PEN 40 MG/0.4ML PNKT: 40 | 28 days supply | Qty: 2 | Fill #2

## 2019-08-14 NOTE — Progress Notes (Deleted)
Office Visit Note  Patient: Karen Bryant             Date of Birth: 10/12/1955           MRN: [id number]             PCP: Monico Blitz, MD Referring: Monico Blitz, MD Visit Date: 08/28/2019 Occupation: @GUAROCC @  Subjective:  No chief complaint on file.  Humira 40 mg every 14 days.  Last TB gold negative on 05/28/2019 and will monitor yearly.  Most recent CBC/CMP within normal limits on 07/04/2019 and will monitor every 3 months.  History of Present Illness: Naziya Hegwood is a 63 y.o. female ***   Activities of Daily Living:  Patient reports morning stiffness for *** {minute/hour:19697}.   Patient {ACTIONS;DENIES/REPORTS:21021675::"Denies"} nocturnal pain.  Difficulty dressing/grooming: {ACTIONS;DENIES/REPORTS:21021675::"Denies"} Difficulty climbing stairs: {ACTIONS;DENIES/REPORTS:21021675::"Denies"} Difficulty getting out of chair: {ACTIONS;DENIES/REPORTS:21021675::"Denies"} Difficulty using hands for taps, buttons, cutlery, and/or writing: {ACTIONS;DENIES/REPORTS:21021675::"Denies"}  No Rheumatology ROS completed.   PMFS History:  Patient Active Problem List   Diagnosis Date Noted  . High risk medication use 01/20/2017  . 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 Essential hypertension 01/20/2017  . History of depression 01/20/2017  . Psoriatic arthropathy ([place]) 01/16/2017  . Psoriasis 01/16/2017  . Primary osteoarthritis of both feet 01/16/2017  . Primary osteoarthritis of both hands 01/16/2017  . Primary osteoarthritis of both knees 01/16/2017    Past Medical History:  Diagnosis Date  . Psoriatic arthritis ([place])     Family History  Problem Relation Age of Onset  . Alzheimer's disease Mother   . Heart failure Mother   . Parkinson's disease Father   . Cancer Father        prostate  . Cancer Sister        breast cancer   . Healthy Daughter   . Healthy Daughter    Past Surgical History:  Procedure Laterality Date  . TUBAL LIGATION     Social History   Social  History Narrative  . Not on file    There is no immunization history on file for this patient.   Objective: Vital Signs: There were no vitals taken for this visit.   Physical Exam   Musculoskeletal Exam: ***  CDAI Exam: CDAI Score: - Patient Global: -; Provider Global: - Swollen: -; Tender: - Joint Exam   No joint exam has been documented for this visit   There is currently no information documented on the homunculus. Go to the Rheumatology activity and complete the homunculus joint exam.  Investigation: No additional findings.  Imaging: No results found.  Recent Labs: Lab Results  Component Value Date   WBC 5.5 07/04/2019   HGB 14.8 07/04/2019   PLT 224 07/04/2019   NA 142 07/04/2019   K 4.6 07/04/2019   CL 105 07/04/2019   CO2 27 07/04/2019   GLUCOSE 72 07/04/2019   BUN 13 07/04/2019   CREATININE 0.92 07/04/2019   BILITOT 0.8 07/04/2019   ALKPHOS 62 06/13/2017   AST 15 07/04/2019   ALT 15 07/04/2019   PROT 6.3 07/04/2019   ALBUMIN 4.1 06/13/2017   CALCIUM 9.5 07/04/2019   GFRAA 77 07/04/2019   QFTBGOLDPLUS NEGATIVE 05/28/2019    Speciality Comments: No specialty comments available.  Procedures:  No procedures performed Allergies: Patient has no known allergies.   Assessment / Plan:     Visit Diagnoses: No diagnosis found.  Orders: No orders of the defined types were placed in this encounter.  No orders of the defined  types were placed in this encounter.   Face-to-face time spent with patient was *** minutes. Greater than 50% of time was spent in counseling and coordination of care.  Follow-Up Instructions: No follow-ups on file.   Ofilia Neas, PA-C  Note - This record has been created using Dragon software.  Chart creation errors have been sought, but may not always  have been located. Such creation errors do not reflect on  the standard of medical care.

## 2019-08-28 ENCOUNTER — Ambulatory Visit: Payer: BC Managed Care – PPO | Admitting: Physician Assistant

## 2019-09-16 ENCOUNTER — Other Ambulatory Visit: Payer: Self-pay | Admitting: Rheumatology

## 2019-09-16 DIAGNOSIS — L4050 Arthropathic psoriasis, unspecified: Secondary | ICD-10-CM

## 2019-09-16 MED FILL — HUMIRA PEN 40 MG/0.4ML PNKT: 40 | 28 days supply | Qty: 2 | Fill #0

## 2019-09-16 NOTE — Telephone Encounter (Signed)
Last Visit: 05/28/2019  Next Visit: message sent to the front desk to schedule.  Labs: 07/04/2019 CMP WNL. Hct is borderline elevated. Rest of CBC WNL.  TB Gold: 05/28/2019 negative   Okay to refill per Dr. Estanislado Pandy.

## 2019-09-27 ENCOUNTER — Telehealth: Payer: Self-pay | Admitting: Rheumatology

## 2019-09-27 NOTE — Telephone Encounter (Signed)
-----   Message from [place] sent at 09/16/2019 12:33 PM EST ----- Patient is due to for an appointment. Please call to schedule. Thanks!

## 2019-09-27 NOTE — Telephone Encounter (Signed)
LMOM for patient to call and schedule follow-up appointment.   °

## 2019-09-30 NOTE — Progress Notes (Signed)
Virtual Visit via Telephone Note  I connected with Karen Bryant on 10/14/19 at 11:30 AM EST by telephone and verified that I am speaking with the correct person using two identifiers.  Location: Patient: Home  Provider: Clinic  This service was conducted via virtual visit.   The patient was located at home. I was located in my office.  Consent was obtained prior to the virtual visit and is aware of possible charges through their insurance for this visit.  The patient is an established patient.  Dr. Estanislado Pandy, MD conducted the virtual visit and Hazel Sams, PA-C acted as scribe during the service.  Office staff helped with scheduling follow up visits after the service was conducted.     I discussed the limitations, risks, security and privacy concerns of performing an evaluation and management service by telephone and the availability of in person appointments. I also discussed with the patient that there may be a patient responsible charge related to this service. The patient expressed understanding and agreed to proceed.  CC: Medication monitoring  History of Present Illness: Patient is a 63 year old female with a past medical history of psoriatic arthritis.  She is on Humira 40 mg sq injections every 14 days.  She has no missed any doses recently. She has not had any recent psoriatic arthritis flares.  She denies any joint pain or joint swelling currently.  She denies any achilles tendonitis, plantar fasciitis, or SI joint pain.  She denies any active psoriasis.   Review of Systems  Constitutional: Negative for fever and malaise/fatigue.  Eyes: Negative for photophobia, pain, discharge and redness.  Respiratory: Negative for cough, shortness of breath and wheezing.   Cardiovascular: Negative for chest pain and palpitations.  Gastrointestinal: Negative for blood in stool, constipation and diarrhea.  Genitourinary: Negative for dysuria.  Musculoskeletal: Negative for back pain, joint pain,  myalgias and neck pain.       +Morning stiffness   Skin: Negative for rash.  Neurological: Negative for dizziness and headaches.  Psychiatric/Behavioral: Negative for depression. The patient is not nervous/anxious and does not have insomnia.       Observations/Objective: Physical Exam  Constitutional: She is oriented to person, place, and time.  Neurological: She is alert and oriented to person, place, and time.  Psychiatric: Mood, memory, affect and judgment normal.   Patient reports morning stiffness for 15  minutes.   Patient denies nocturnal pain.  Difficulty dressing/grooming: Denies Difficulty climbing stairs: Denies Difficulty getting out of chair: Denies Difficulty using hands for taps, buttons, cutlery, and/or writing: Denies   Assessment and Plan: Visit Diagnoses: Psoriatic arthropathy ([place]) -She has not had any recent psoriatic arthritis flares.  She is clinically doing well on Humira 40 mg sq injections every 14 days.  She has no joint pain or joint swelling at this time. Her morning stiffness has been lasting about 15 minutes daily.  She denies any plantar fasciitis or achilles tendonitis.  She denies any SI joint pain.  She has no active psoriasis currently.  She will continue on Humira 40 mg sq injections every 14 days.  She was advised to notify us if she develops increased joint pain or joint swelling.  She will follow up in 4 months.   Psoriasis - She has no active psoriasis at this time.   High risk medication use - Humira 40 mg sq injections every 14 days.  CBC and CMP were drawn on 07/04/19.  She is due to update CBC and CMP.  Standing orders placed today. TB gold negative on 05/28/19.   Medial epicondylitis of elbow, right: Resolved   Trochanteric bursitis, left hip -Resolved  Primary osteoarthritis of both hands - She has intermittent discomfort and stiffness for about 15 minutes in the morning in both hands.  She is not having any joint swelling at this  time.  Joint protection and muscle strengthening were discussed.   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  She has no difficulty climbing steps or getting up from a chair.  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  She denies any achilles tendonitis or plantar fasciitis.   DDD (degenerative disc disease), lumbar - She is not having any lower back pain at this time.  Other medical conditions are listed as follows:   Other fatigue   History of hypertension   Follow Up Instructions: She will follow up in 4 months.    I discussed the assessment and treatment plan with the patient. The patient was provided an opportunity to ask questions and all were answered. The patient agreed with the plan and demonstrated an understanding of the instructions.   The patient was advised to call back or seek an in-person evaluation if the symptoms worsen or if the condition fails to improve as anticipated.  I provided 15 minutes of non-face-to-face time during this encounter.   Pollyann Savoy, MD   Scribed by-  Sherron Ales PA-C

## 2019-10-10 ENCOUNTER — Other Ambulatory Visit: Payer: Self-pay | Admitting: Rheumatology

## 2019-10-14 ENCOUNTER — Encounter: Payer: Self-pay | Admitting: Rheumatology

## 2019-10-14 ENCOUNTER — Other Ambulatory Visit: Payer: Self-pay

## 2019-10-14 ENCOUNTER — Telehealth (INDEPENDENT_AMBULATORY_CARE_PROVIDER_SITE_OTHER): Payer: BC Managed Care – PPO | Admitting: Rheumatology

## 2019-10-14 DIAGNOSIS — L4050 Arthropathic psoriasis, unspecified: Secondary | ICD-10-CM

## 2019-10-14 DIAGNOSIS — E559 Vitamin D deficiency, unspecified: Secondary | ICD-10-CM

## 2019-10-14 DIAGNOSIS — R5383 Other fatigue: Secondary | ICD-10-CM

## 2019-10-14 DIAGNOSIS — Z79899 Other long term (current) drug therapy: Secondary | ICD-10-CM

## 2019-10-14 DIAGNOSIS — M19042 Primary osteoarthritis, left hand: Secondary | ICD-10-CM

## 2019-10-14 DIAGNOSIS — Z8679 Personal history of other diseases of the circulatory system: Secondary | ICD-10-CM

## 2019-10-14 DIAGNOSIS — M19041 Primary osteoarthritis, right hand: Secondary | ICD-10-CM

## 2019-10-14 DIAGNOSIS — M7062 Trochanteric bursitis, left hip: Secondary | ICD-10-CM | POA: Diagnosis not present

## 2019-10-14 DIAGNOSIS — M170 Bilateral primary osteoarthritis of knee: Secondary | ICD-10-CM

## 2019-10-14 DIAGNOSIS — L409 Psoriasis, unspecified: Secondary | ICD-10-CM

## 2019-10-14 DIAGNOSIS — M51369 Other intervertebral disc degeneration, lumbar region without mention of lumbar back pain or lower extremity pain: Secondary | ICD-10-CM

## 2019-10-14 DIAGNOSIS — M19072 Primary osteoarthritis, left ankle and foot: Secondary | ICD-10-CM

## 2019-10-14 DIAGNOSIS — M5136 Other intervertebral disc degeneration, lumbar region: Secondary | ICD-10-CM

## 2019-10-14 DIAGNOSIS — M19071 Primary osteoarthritis, right ankle and foot: Secondary | ICD-10-CM

## 2019-10-14 DIAGNOSIS — M7701 Medial epicondylitis, right elbow: Secondary | ICD-10-CM

## 2019-10-14 NOTE — Addendum Note (Signed)
Addended by: Gearldine Bienenstock on: 10/14/2019 11:58 AM   Modules accepted: Orders

## 2019-10-23 ENCOUNTER — Telehealth: Payer: Self-pay | Admitting: Rheumatology

## 2019-10-23 NOTE — Telephone Encounter (Signed)
-----   Message from Sharon H Caudle, RT sent at 10/14/2019  1:39 PM EST ----- Regarding: 4 MONTH F/U   

## 2019-10-23 NOTE — Telephone Encounter (Signed)
I LMOM for patient to call, and schedule a follow up appointment for around 02/11/2020 with Sherron Ales, PAC.

## 2019-11-06 ENCOUNTER — Telehealth: Payer: Self-pay | Admitting: Pharmacy Technician

## 2019-11-06 NOTE — Telephone Encounter (Signed)
Received notification from [facility] that they have not been able to reach patient to schedule Humira refill. Last refill was on 09/16/19.  Called patient and left message.  3:39 PM Dorthula Nettles, CPhT

## 2019-11-12 NOTE — Telephone Encounter (Signed)
Attempted to contact the patient and left message to advise patient Karen Bryant is trying to reach her to schedule refill on Humira. Provided patient with phone number.

## 2020-03-31 ENCOUNTER — Telehealth: Payer: Self-pay | Admitting: Pharmacy Technician

## 2020-03-31 DIAGNOSIS — Z79899 Other long term (current) drug therapy: Secondary | ICD-10-CM

## 2020-03-31 DIAGNOSIS — Z9225 Personal history of immunosupression therapy: Secondary | ICD-10-CM

## 2020-03-31 DIAGNOSIS — Z111 Encounter for screening for respiratory tuberculosis: Secondary | ICD-10-CM

## 2020-03-31 NOTE — Telephone Encounter (Signed)
Patient reached out to [facility] to resume her Humira. Patient said she stopped because she thought she didn't need it, but now realizes that she does. Her last refill was 08/13/19 and last labs were 10/14/19.   Please advise.

## 2020-03-31 NOTE — Telephone Encounter (Signed)
Please advise patient to get labs (CBC, CMP TB Gold) as soon as possible and then she can come in for an appointment 2 weeks later to restart Humira.

## 2020-03-31 NOTE — Telephone Encounter (Signed)
Patient advised (CBC, CMP TB Gold) as soon as possible and then she can come in for an appointment 2 weeks later to restart Humira. Patient will call the office to schedule appointment after she has her labs done. Lab orders released for patient.

## 2020-04-02 NOTE — Telephone Encounter (Signed)
CBC is normal.

## 2020-04-02 NOTE — Telephone Encounter (Signed)
CBC and CMP are normal.

## 2020-04-03 LAB — QUANTIFERON-TB GOLD PLUS
Mitogen-NIL: >10 IU/mL
NIL: 0.04 IU/mL
QuantiFERON-TB Gold Plus: NEGATIVE
TB1-NIL: <0 IU/mL
TB2-NIL: <0 IU/mL

## 2020-04-03 LAB — COMPLETE METABOLIC PANEL WITH GFR
AG Ratio: 1.7 (calc) (ref 1.0–2.5)
ALT: 15 U/L (ref 6–29)
AST: 14 U/L (ref 10–35)
Albumin: 4.2 g/dL (ref 3.6–5.1)
Alkaline phosphatase (APISO): 79 U/L (ref 37–153)
BUN: 15 mg/dL (ref 7–25)
CO2: 27 mmol/L (ref 20–32)
Calcium: 9.3 mg/dL (ref 8.6–10.4)
Chloride: 104 mmol/L (ref 98–110)
Creat: 0.84 mg/dL (ref 0.50–0.99)
GFR, Est African American: 86 mL/min/{1.73_m2} (ref >=60)
GFR, Est Non African American: 74 mL/min/{1.73_m2} (ref >=60)
Globulin: 2.5 g/dL (calc) (ref 1.9–3.7)
Glucose, Bld: 97 mg/dL (ref 65–139)
Potassium: 4.5 mmol/L (ref 3.5–5.3)
Sodium: 139 mmol/L (ref 135–146)
Total Bilirubin: 0.7 mg/dL (ref 0.2–1.2)
Total Protein: 6.7 g/dL (ref 6.1–8.1)

## 2020-04-03 LAB — CBC WITH DIFFERENTIAL/PLATELET
Absolute Monocytes: 323 cells/uL (ref 200–950)
Basophils Absolute: 60 cells/uL (ref 0–200)
Basophils Relative: 1.4 %
Eosinophils Absolute: 30 cells/uL (ref 15–500)
Eosinophils Relative: 0.7 %
HCT: 42.5 % (ref 35.0–45.0)
Hemoglobin: 14.1 g/dL (ref 11.7–15.5)
Lymphs Abs: 1643 cells/uL (ref 850–3900)
MCH: 29 pg (ref 27.0–33.0)
MCHC: 33.2 g/dL (ref 32.0–36.0)
MCV: 87.3 fL (ref 80.0–100.0)
MPV: 10.9 fL (ref 7.5–12.5)
Monocytes Relative: 7.5 %
Neutro Abs: 2245 cells/uL (ref 1500–7800)
Neutrophils Relative %: 52.2 %
Platelets: 233 10*3/uL (ref 140–400)
RBC: 4.87 10*6/uL (ref 3.80–5.10)
RDW: 13.5 % (ref 11.0–15.0)
Total Lymphocyte: 38.2 %
WBC: 4.3 10*3/uL (ref 3.8–10.8)

## 2020-04-03 NOTE — Telephone Encounter (Signed)
TB gold negative

## 2020-04-08 NOTE — Progress Notes (Signed)
Office Visit Note  Patient: Karen Bryant             Date of Birth: 1955-12-11           MRN: [id number]             PCP: Kirstie Peri, MD Referring: Kirstie Peri, MD Visit Date: 04/20/2020 Occupation: @GUAROCC @  Subjective:  Left knee joint pain   History of Present Illness: Karen Bryant is a 64 y.o. female  With history of psoriatic arthritis and osteoarthritis.  She will be restarting on Humira 40 mg sq injections today.  She has been off of Humira since December 2020.  She has not had any recent infections.  She has received the COVID-19 vaccination.  She states she has been having severe left knee joint pain for the past several weeks.  She denies any injuries or falls.she denies any mechanical symptoms.  She has had some warmth and swelling in the left knee joint.  She is also been experiencing nocturnal pain.  She reports that she was evaluated by her PCP on 04/06/2020 and had x-rays of the left knee joint at that time which revealed mild medial compartment narrowing.  Patient reports that she took a prednisone taper which resolved her symptoms temporarily but her discomfort has returned.  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  She is also having increased discomfort in the toes of her right foot.  She denies any SI joint pain at this time.  She denies any Achilles tendinitis or plantar fasciitis.  She denies any active psoriasis currently.  Activities of Daily Living:  Patient reports morning stiffness for 1  hour.   Patient Reports nocturnal pain.  Difficulty dressing/grooming: Denies Difficulty climbing stairs: Reports Difficulty getting out of chair: Reports Difficulty using hands for taps, buttons, cutlery, and/or writing: Reports  Review of Systems  Constitutional: Positive for fatigue.  HENT: Negative for mouth sores, mouth dryness and nose dryness.   Eyes: Negative for pain, itching, visual  disturbance and dryness.  Respiratory: Negative for cough, hemoptysis, shortness of breath and difficulty breathing.   Cardiovascular: Negative for chest pain, palpitations, hypertension and swelling in legs/feet.  Gastrointestinal: Negative for blood in stool, constipation and diarrhea.  Endocrine: Negative for increased urination.  Genitourinary: Negative for difficulty urinating and painful urination.  Musculoskeletal: Positive for arthralgias, joint pain, joint swelling, myalgias, morning stiffness, muscle tenderness and myalgias. Negative for muscle weakness.  Skin: Negative for color change, pallor, rash, hair loss, nodules/bumps, redness, skin tightness, ulcers and sensitivity to sunlight.  Allergic/Immunologic: Negative for susceptible to infections.  Neurological: Negative for dizziness, numbness, headaches, memory loss and weakness.  Hematological: Negative for bruising/bleeding tendency and swollen glands.  Psychiatric/Behavioral: Negative for depressed mood, confusion and sleep disturbance. The patient is not nervous/anxious.     PMFS History:  Patient Active Problem List   Diagnosis Date Noted  . High risk medication use 01/20/2017  . 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 Essential hypertension 01/20/2017  . History of depression 01/20/2017  . Psoriatic arthropathy (HCC) 01/16/2017  . Psoriasis 01/16/2017  . Primary osteoarthritis of both feet 01/16/2017  . Primary osteoarthritis of both hands 01/16/2017  . Primary osteoarthritis of both knees 01/16/2017    Past Medical History:  Diagnosis Date  . Psoriatic arthritis (HCC)     Family History  Problem Relation Age of Onset  . Alzheimer's disease Mother   .  Heart failure Mother   . Parkinson's disease Father   . Cancer Father        prostate  . Cancer Sister        breast cancer   . Healthy Daughter   . Healthy Daughter    Past Surgical History:  Procedure Laterality Date  . TUBAL LIGATION     Social History    Social History Narrative  . Not on file    There is no immunization history on file for this patient.   Objective: Vital Signs: BP (!) 155/94 (BP Location: Left Arm, Patient Position: Sitting, Cuff Size: Normal)   Pulse 78   Resp 17   Ht 5\' 5"  (1.651 m)   Wt 223 lb 3.2 oz (101.2 kg)   BMI 37.14 kg/m    Physical Exam Vitals and nursing note reviewed.  Constitutional:      Appearance: She is well-developed.  HENT:     Head: Normocephalic and atraumatic.  Eyes:     Conjunctiva/sclera: Conjunctivae normal.  Pulmonary:     Effort: Pulmonary effort is normal.  Abdominal:     General: Bowel sounds are normal.     Palpations: Abdomen is soft.  Musculoskeletal:     Cervical back: Normal range of motion.  Lymphadenopathy:     Cervical: No cervical adenopathy.  Skin:    General: Skin is warm and dry.     Capillary Refill: Capillary refill takes less than 2 seconds.  Neurological:     Mental Status: She is alert and oriented to person, place, and time.  Psychiatric:        Behavior: Behavior normal.      Musculoskeletal Exam: C-spine, thoracic spine, lumbar spine have good range of motion.  She has midline spinal tenderness in the lumbar region.  No SI joint tenderness.  Shoulder joints, elbow joints, wrist joints, MCPs, PIPs, and DIPs have good range of motion with no synovitis.  She has PIP and DIP thickening consistent with osteoarthritis of both hands.  Hip joints have good range of motion.  Left knee has painful ROM.  Warmth and effusion of the left knee joint noted.  Tenderness of the left calf.  Right knee joint has good ROM with no discomfort.  Ankle joints good ROM with no tenderness.  Tenderness and thickening of the left 1st MTP joint.  PIP and DIP thickening consistent with osteoarthritis of both feet.  Tenderness of the right 2nd MTP joint.   CDAI Exam: CDAI Score: 2.6  Patient Global: 3 mm; Provider Global: 3 mm Swollen: 1 ; Tender: 3  Joint Exam 04/20/2020       Right  Left  Knee     Swollen Tender  MTP 1      Tender  MTP 2   Tender        Investigation: No additional findings.  Imaging: No results found.  Recent Labs: Lab Results  Component Value Date   WBC 4.3 04/01/2020   HGB 14.1 04/01/2020   PLT 233 04/01/2020   NA 139 04/01/2020   K 4.5 04/01/2020   CL 104 04/01/2020   CO2 27 04/01/2020   GLUCOSE 97 04/01/2020   BUN 15 04/01/2020   CREATININE 0.84 04/01/2020   BILITOT 0.7 04/01/2020   ALKPHOS 62 06/13/2017   AST 14 04/01/2020   ALT 15 04/01/2020   PROT 6.7 04/01/2020   ALBUMIN 4.1 06/13/2017   CALCIUM 9.3 04/01/2020   GFRAA 86 04/01/2020   QFTBGOLDPLUS NEGATIVE 04/01/2020  Speciality Comments: No specialty comments available.  Procedures:  No procedures performed Allergies: Patient has no known allergies.   Assessment / Plan:     Visit Diagnoses: Psoriatic arthropathy (HCC) - She presents today with severe left knee joint pain restarted several weeks ago.  She does not have any injuries or falls prior to the onset of symptoms.  She has painful range of motion of the left knee joint and is experiencing tenderness of the left calf.  She has a warmth and effusion of the left knee joint on exam today.  She declined a left knee joint cortisone injection.  She did updated x-rays on 04/06/2020 which revealed mild medial compartment narrowing.  She has been off of Humira since December 2020 and will be resuming today.  She was monitored after administration of the injection today in the office.  She has been experiencing increased stiffness in both hands and has noticed increased pain in her right foot.  X-rays of both hands and feet were obtained today to assess for radiographic progression.  She will continue on Humira 40 mg IV days injections every 14 days.  She was advised to notify us of her left knee joint pain persists or worsens.  She will follow-up in the office in 3 months. Plan: XR Hand 2 View Right, XR Hand 2 View  Left, XR Foot 2 Views Right, XR Foot 2 Views Left  Psoriasis: She has no active psoriasis at this time.  High risk medication use - She will be restarting on Humira 40 mg sq injections every 14 days.  CBC and CMP were within normal limits on 04/01/2020.  TB gold was negative on 03/12/2020.  She will return for lab work in 1 month and then every 3 months to monitor for drug toxicity.  Standing orders for CBC and CMP are in place.  We discussed the importance of holding Humira if she develops any signs or symptoms of an infection and to resume once the infection has completely cleared.  She has received both COVID-19 vaccinations.  We discussed the importance of yearly skin exams while on Humira due to the increased risk of skin cancer.  Medial epicondylitis of elbow, right - Resolved  Primary osteoarthritis of both hands: She has PIP and DIP thickening consistent with osteoarthritis of both hands.  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  No synovitis was noted today.  She has complete fist formation bilaterally.  Joint protection and muscle strengthening were discussed.  X-rays of both hands were obtained today to assess for radiographic progression.  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  No warmth or effusion of the right knee joint was noted.  She presents today with severe left knee joint pain which started several weeks ago.  She has not had any recent injuries or falls.  She has painful range of motion of the left knee joint.  Warmth and an effusion of the left knee were noted.  She had x-rays ordered at her PCPs office on 04/06/2020 which revealed mild medial compartment joint space narrowing.  She was prescribed a prednisone taper which provided temporary relief.  Her discomfort has returned.  She has been experiencing nocturnal pain intermittently.  She has not  had any mechanical symptoms.  She has been taking ibuprofen as needed for pain relief.  She has tenderness to palpation  of the left calf.  We will proceed with scheduling ultrasound of the left lower extremity for further evaluation. Results negative for a DVT.  She declined a left knee joint cortisone injection today.  She will be restarting on Humira today in the office.  She was advised to notify us of her left knee joint pain and swelling persists or worsens.  She was given a handout of knee joint exercises to start performing.  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  X-rays of the both feet were obtained today to assess for radiographic progression.  We discussed the importance of wearing proper fitting shoes.  Pain in both hands - She has been experiencing increased stiffness in both hands recently.  No synovitis was noted on exam.  She has PIP and DIP thickening consistent with osteoarthritis of both hands.  X-rays of both hands were updated today to assess for radiographic progression.  Plan: XR Hand 2 View Right, XR Hand 2 View Left  Pain in both feet - She has been experiencing increased discomfort in her right foot.  She has tenderness of the right second MTP joint.  She has PIP and DIP thickening consistent with osteoarthritis of both feet.  She has tenderness over the bunion of the left first MTP.  X-rays of both feet were obtained today to assess for radiographic progression.  Plan: XR Foot 2 Views Right, XR Foot 2 Views Left  Vitamin D deficiency -Vitamin D was 16 on 07/04/2019.  Future order for vitamin D level was placed today.  She has not been taking a vitamin D supplement recently.  She was encouraged to take vitamin D 2000 units by mouth daily.  Plan: VITAMIN D 25 Hydroxy (Vit-D Deficiency, Fractures)  DDD (degenerative disc disease), lumbar: She experiences intermittent midline discomfort in the lumbar  spine region.  She has midline spinal tenderness today.  She is not experiencing any SI joint tenderness.  Trochanteric bursitis, left hip -She has intermittent tenderness over the left trochanteric bursa.  Other fatigue: Chronic but stable.  History of hypertension   Orders: Orders Placed This Encounter  Procedures  . XR Hand 2 View Right  . XR Hand 2 View Left  . XR Foot 2 Views Right  . XR Foot 2 Views Left  . VITAMIN D 25 Hydroxy (Vit-D Deficiency, Fractures)  . VAS US LOWER EXTREMITY VENOUS (DVT)   Meds ordered this encounter  Medications  . Adalimumab (HUMIRA PEN) 40 MG/0.4ML PNKT    Sig: Inject 40 mg into the skin every 14 (fourteen) days.    Dispense:  6 each    Refill:  0    6 pens= 3 kits    Face-to-face time spent with patient was 30 minutes. Greater than 50% of time was spent in counseling and coordination of care.  Follow-Up Instructions: Return in about 3 months (around 07/21/2020) for Psoriatic arthritis, Osteoarthritis, DDD.   Gearldine Bienenstockaylor M Zaylin Pistilli, PA-C  Note - This record has been created using Dragon software.  Chart creation errors have been sought, but may not always  have been located. Such creation errors do not reflect on  the standard of medical care.

## 2020-04-20 ENCOUNTER — Ambulatory Visit: Payer: Self-pay

## 2020-04-20 ENCOUNTER — Encounter (INDEPENDENT_AMBULATORY_CARE_PROVIDER_SITE_OTHER): Payer: Self-pay

## 2020-04-20 ENCOUNTER — Other Ambulatory Visit: Payer: Self-pay

## 2020-04-20 ENCOUNTER — Telehealth: Payer: Self-pay

## 2020-04-20 ENCOUNTER — Encounter: Payer: Self-pay | Admitting: Physician Assistant

## 2020-04-20 ENCOUNTER — Ambulatory Visit (HOSPITAL_COMMUNITY)
Admission: RE | Admit: 2020-04-20 | Discharge: 2020-04-20 | Disposition: A | Discharge status: Home / Self Care | Payer: BC Managed Care – PPO | Source: Ambulatory Visit | Attending: Physician Assistant | Admitting: Physician Assistant

## 2020-04-20 ENCOUNTER — Ambulatory Visit: Payer: BC Managed Care – PPO | Admitting: Physician Assistant

## 2020-04-20 VITALS — BP 155/94 | HR 78 | Resp 17 | Ht 65.0 in | Wt 223.2 lb

## 2020-04-20 DIAGNOSIS — M79671 Pain in right foot: Secondary | ICD-10-CM

## 2020-04-20 DIAGNOSIS — M79642 Pain in left hand: Secondary | ICD-10-CM

## 2020-04-20 DIAGNOSIS — M79672 Pain in left foot: Secondary | ICD-10-CM

## 2020-04-20 DIAGNOSIS — R5383 Other fatigue: Secondary | ICD-10-CM

## 2020-04-20 DIAGNOSIS — M7989 Other specified soft tissue disorders: Secondary | ICD-10-CM

## 2020-04-20 DIAGNOSIS — E559 Vitamin D deficiency, unspecified: Secondary | ICD-10-CM

## 2020-04-20 DIAGNOSIS — M170 Bilateral primary osteoarthritis of knee: Secondary | ICD-10-CM

## 2020-04-20 DIAGNOSIS — L4050 Arthropathic psoriasis, unspecified: Secondary | ICD-10-CM

## 2020-04-20 DIAGNOSIS — Z79899 Other long term (current) drug therapy: Secondary | ICD-10-CM | POA: Diagnosis not present

## 2020-04-20 DIAGNOSIS — L409 Psoriasis, unspecified: Secondary | ICD-10-CM | POA: Diagnosis not present

## 2020-04-20 DIAGNOSIS — Z8679 Personal history of other diseases of the circulatory system: Secondary | ICD-10-CM

## 2020-04-20 DIAGNOSIS — M79605 Pain in left leg: Secondary | ICD-10-CM

## 2020-04-20 DIAGNOSIS — M19072 Primary osteoarthritis, left ankle and foot: Secondary | ICD-10-CM

## 2020-04-20 DIAGNOSIS — M79641 Pain in right hand: Secondary | ICD-10-CM | POA: Diagnosis not present

## 2020-04-20 DIAGNOSIS — M19071 Primary osteoarthritis, right ankle and foot: Secondary | ICD-10-CM

## 2020-04-20 DIAGNOSIS — M7701 Medial epicondylitis, right elbow: Secondary | ICD-10-CM | POA: Diagnosis not present

## 2020-04-20 DIAGNOSIS — M7062 Trochanteric bursitis, left hip: Secondary | ICD-10-CM

## 2020-04-20 DIAGNOSIS — M19042 Primary osteoarthritis, left hand: Secondary | ICD-10-CM

## 2020-04-20 DIAGNOSIS — M5136 Other intervertebral disc degeneration, lumbar region: Secondary | ICD-10-CM

## 2020-04-20 DIAGNOSIS — M19041 Primary osteoarthritis, right hand: Secondary | ICD-10-CM

## 2020-04-20 DIAGNOSIS — M51369 Other intervertebral disc degeneration, lumbar region without mention of lumbar back pain or lower extremity pain: Secondary | ICD-10-CM

## 2020-04-20 MED ORDER — HUMIRA (2 PEN) 40 MG/0.4ML ~~LOC~~ AJKT: 40.0000 mg | AUTO-INJECTOR | SUBCUTANEOUS | 0 refills | Status: DC

## 2020-04-20 NOTE — Telephone Encounter (Signed)
Erica called from [facility] and patient is negative for any DVT.

## 2020-04-20 NOTE — Telephone Encounter (Signed)
Thank you for informing me.  Please notify the patient if results were not discussed with her at the appointment.

## 2020-04-20 NOTE — Patient Instructions (Addendum)
[address] [place],  [place]  [address]  Arrive at 11:45 am today.    Standing Labs We placed an order today for your standing lab work.   Please have your standing labs drawn in 1 month then every 3 months   If possible, please have your labs drawn 2 weeks prior to your appointment so that the provider can discuss your results at your appointment.  We have open lab daily Monday through Thursday from 8:30-12:30 PM and 1:30-4:30 PM and Friday from 8:30-12:30 PM and 1:30-4:00 PM at the office of Dr. Pollyann Savoy, [facility].   Please be advised, patients with office appointments requiring lab work will take precedents over walk-in lab work.  If possible, please come for your lab work on Monday and Friday afternoons, as you may experience shorter wait times. The office is located at [address], [place], [place] [address] No appointment is necessary.   Labs are drawn by [facility]. Please bring your co-pay at the time of your lab draw.  You may receive a bill from [facility] for your lab work.  If you wish to have your labs drawn at another location, please call the office 24 hours in advance to send orders.  If you have any questions regarding directions or hours of operation,  please call [phone].   As a reminder, please drink plenty of water prior to coming for your lab work. Thanks!   Journal for Nurse Practitioners, 15(4), [phone]. Retrieved July 16, 2018 from http://clinicalkey.com/nursing">  Knee Exercises Ask your health care provider which exercises are safe for you. Do exercises exactly as told by your health care provider and adjust them as directed. It is normal to feel mild stretching, pulling, tightness, or discomfort as you do these exercises. Stop right away if you feel sudden pain or your pain gets worse. Do not begin these exercises until told by your health care provider. Stretching and range-of-motion exercises These exercises warm up  your muscles and joints and improve the movement and flexibility of your knee. These exercises also help to relieve pain and swelling. Knee extension, prone 1. Lie on your abdomen (prone position) on a bed. 2. Place your left / right knee just beyond the edge of the surface so your knee is not on the bed. You can put a towel under your left / right thigh just above your kneecap for comfort. 3. Relax your leg muscles and allow gravity to straighten your knee (extension). You should feel a stretch behind your left / right knee. 4. Hold this position for __________ seconds. 5. Scoot up so your knee is supported between repetitions. Repeat __________ times. Complete this exercise __________ times a day. Knee flexion, active  1. Lie on your back with both legs straight. If this causes back discomfort, bend your left / right knee so your foot is flat on the floor. 2. Slowly slide your left / right heel back toward your buttocks. Stop when you feel a gentle stretch in the front of your knee or thigh (flexion). 3. Hold this position for __________ seconds. 4. Slowly slide your left / right heel back to the starting position. Repeat __________ times. Complete this exercise __________ times a day. Quadriceps stretch, prone  1. Lie on your abdomen on a firm surface, such as a bed or padded floor. 2. Bend your left / right knee and hold your ankle. If you cannot reach your ankle or pant leg, loop a belt around your  foot and grab the belt instead. 3. Gently pull your heel toward your buttocks. Your knee should not slide out to the side. You should feel a stretch in the front of your thigh and knee (quadriceps). 4. Hold this position for __________ seconds. Repeat __________ times. Complete this exercise __________ times a day. Hamstring, supine 1. Lie on your back (supine position). 2. Loop a belt or towel over the ball of your left / right foot. The ball of your foot is on the walking surface, right  under your toes. 3. Straighten your left / right knee and slowly pull on the belt to raise your leg until you feel a gentle stretch behind your knee (hamstring). ? Do not let your knee bend while you do this. ? Keep your other leg flat on the floor. 4. Hold this position for __________ seconds. Repeat __________ times. Complete this exercise __________ times a day. Strengthening exercises These exercises build strength and endurance in your knee. Endurance is the ability to use your muscles for a long time, even after they get tired. Quadriceps, isometric This exercise stretches the muscles in front of your thigh (quadriceps) without moving your knee joint (isometric). 1. Lie on your back with your left / right leg extended and your other knee bent. Put a rolled towel or small pillow under your knee if told by your health care provider. 2. Slowly tense the muscles in the front of your left / right thigh. You should see your kneecap slide up toward your hip or see increased dimpling just above the knee. This motion will push the back of the knee toward the floor. 3. For __________ seconds, hold the muscle as tight as you can without increasing your pain. 4. Relax the muscles slowly and completely. Repeat __________ times. Complete this exercise __________ times a day. Straight leg raises This exercise stretches the muscles in front of your thigh (quadriceps) and the muscles that move your hips (hip flexors). 1. Lie on your back with your left / right leg extended and your other knee bent. 2. Tense the muscles in the front of your left / right thigh. You should see your kneecap slide up or see increased dimpling just above the knee. Your thigh may even shake a bit. 3. Keep these muscles tight as you raise your leg 4-6 inches (10-15 cm) off the floor. Do not let your knee bend. 4. Hold this position for __________ seconds. 5. Keep these muscles tense as you lower your leg. 6. Relax your muscles  slowly and completely after each repetition. Repeat __________ times. Complete this exercise __________ times a day. Hamstring, isometric 1. Lie on your back on a firm surface. 2. Bend your left / right knee about __________ degrees. 3. Dig your left / right heel into the surface as if you are trying to pull it toward your buttocks. Tighten the muscles in the back of your thighs (hamstring) to "dig" as hard as you can without increasing any pain. 4. Hold this position for __________ seconds. 5. Release the tension gradually and allow your muscles to relax completely for __________ seconds after each repetition. Repeat __________ times. Complete this exercise __________ times a day. Hamstring curls If told by your health care provider, do this exercise while wearing ankle weights. Begin with __________ lb weights. Then increase the weight by 1 lb (0.5 kg) increments. Do not wear ankle weights that are more than __________ lb. 1. Lie on your abdomen with your legs straight.  2. Bend your left / right knee as far as you can without feeling pain. Keep your hips flat against the floor. 3. Hold this position for __________ seconds. 4. Slowly lower your leg to the starting position. Repeat __________ times. Complete this exercise __________ times a day. Squats This exercise strengthens the muscles in front of your thigh and knee (quadriceps). 1. Stand in front of a table, with your feet and knees pointing straight ahead. You may rest your hands on the table for balance but not for support. 2. Slowly bend your knees and lower your hips like you are going to sit in a chair. ? Keep your weight over your heels, not over your toes. ? Keep your lower legs upright so they are parallel with the table legs. ? Do not let your hips go lower than your knees. ? Do not bend lower than told by your health care provider. ? If your knee pain increases, do not bend as low. 3. Hold the squat position for __________  seconds. 4. Slowly push with your legs to return to standing. Do not use your hands to pull yourself to standing. Repeat __________ times. Complete this exercise __________ times a day. Wall slides This exercise strengthens the muscles in front of your thigh and knee (quadriceps). 1. Lean your back against a smooth wall or door, and walk your feet out 18-24 inches (46-61 cm) from it. 2. Place your feet hip-width apart. 3. Slowly slide down the wall or door until your knees bend __________ degrees. Keep your knees over your heels, not over your toes. Keep your knees in line with your hips. 4. Hold this position for __________ seconds. Repeat __________ times. Complete this exercise __________ times a day. Straight leg raises This exercise strengthens the muscles that rotate the leg at the hip and move it away from your body (hip abductors). 1. Lie on your side with your left / right leg in the top position. Lie so your head, shoulder, knee, and hip line up. You may bend your bottom knee to help you keep your balance. 2. Roll your hips slightly forward so your hips are stacked directly over each other and your left / right knee is facing forward. 3. Leading with your heel, lift your top leg 4-6 inches (10-15 cm). You should feel the muscles in your outer hip lifting. ? Do not let your foot drift forward. ? Do not let your knee roll toward the ceiling. 4. Hold this position for __________ seconds. 5. Slowly return your leg to the starting position. 6. Let your muscles relax completely after each repetition. Repeat __________ times. Complete this exercise __________ times a day. Straight leg raises This exercise stretches the muscles that move your hips away from the front of the pelvis (hip extensors). 1. Lie on your abdomen on a firm surface. You can put a pillow under your hips if that is more comfortable. 2. Tense the muscles in your buttocks and lift your left / right leg about 4-6 inches  (10-15 cm). Keep your knee straight as you lift your leg. 3. Hold this position for __________ seconds. 4. Slowly lower your leg to the starting position. 5. Let your leg relax completely after each repetition. Repeat __________ times. Complete this exercise __________ times a day. This information is not intended to replace advice given to you by your health care provider. Make sure you discuss any questions you have with your health care provider. Document Revised: 07/17/2018 Document Reviewed:  07/17/2018 Elsevier Patient Education  2020 [employer].

## 2020-04-20 NOTE — Telephone Encounter (Signed)
Called patient and she was informed of results at the appointment.

## 2020-04-20 NOTE — Progress Notes (Signed)
I called the patient to review x-rays obtained today.  All questions were addressed.  We will repeat x-rays of her hands and feet in 2-3 years. She will continue on Humira 40 mg sq injections every 14 days.

## 2020-04-20 NOTE — Progress Notes (Signed)
Pharmacy Note  Subjective:   Patient presents to clinic today to restart Humira.  Patient running a fever or have signs/symptoms of infection? No  Patient currently on antibiotics for the treatment of infection? No  Patient have any upcoming invasive procedures/surgeries? No   Assessment/Plan:  Patient has previously injected Humira and today presented in the office to restart. Patient self injected in the with:  Sample Medication: Humira NDC: 7619-5093-26 Lot: [id number] Expiration: 04/2021  Patient tolerated well.  Observed for 30 mins in office for adverse reaction and none noted.

## 2020-04-20 NOTE — Progress Notes (Signed)
Patient has been notified that the ultrasound was negative for a DVT.

## 2020-04-29 MED FILL — HUMIRA PEN 40 MG/0.4ML PNKT: 40 | 28 days supply | Qty: 2 | Fill #0

## 2020-05-26 MED FILL — HUMIRA PEN 40 MG/0.4ML PNKT: 40 | 28 days supply | Qty: 2 | Fill #1

## 2020-06-23 MED FILL — HUMIRA PEN 40 MG/0.4ML PNKT: 40 | 28 days supply | Qty: 2 | Fill #2

## 2020-07-06 NOTE — Progress Notes (Deleted)
Office Visit Note  Patient: Karen Bryant             Date of Birth: Feb 20, 1956           MRN: [id number]             PCP: Kirstie Peri, MD Referring: Kirstie Peri, MD Visit Date: 07/20/2020 Occupation: @GUAROCC @  Subjective:  No chief complaint on file.   History of Present Illness: Karen Bryant is a 64 y.o. female ***   Activities of Daily Living:  Patient reports morning stiffness for *** {minute/hour:19697}.   Patient {ACTIONS;DENIES/REPORTS:21021675::"Denies"} nocturnal pain.  Difficulty dressing/grooming: {ACTIONS;DENIES/REPORTS:21021675::"Denies"} Difficulty climbing stairs: {ACTIONS;DENIES/REPORTS:21021675::"Denies"} Difficulty getting out of chair: {ACTIONS;DENIES/REPORTS:21021675::"Denies"} Difficulty using hands for taps, buttons, cutlery, and/or writing: {ACTIONS;DENIES/REPORTS:21021675::"Denies"}  No Rheumatology ROS completed.   PMFS History:  Patient Active Problem List   Diagnosis Date Noted  . High risk medication use 01/20/2017  . 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 Essential hypertension 01/20/2017  . History of depression 01/20/2017  . Psoriatic arthropathy (HCC) 01/16/2017  . Psoriasis 01/16/2017  . Primary osteoarthritis of both feet 01/16/2017  . Primary osteoarthritis of both hands 01/16/2017  . Primary osteoarthritis of both knees 01/16/2017    Past Medical History:  Diagnosis Date  . Psoriatic arthritis (HCC)     Family History  Problem Relation Age of Onset  . Alzheimer's disease Mother   . Heart failure Mother   . Parkinson's disease Father   . Cancer Father        prostate  . Cancer Sister        breast cancer   . Healthy Daughter   . Healthy Daughter    Past Surgical History:  Procedure Laterality Date  . TUBAL LIGATION     Social History   Social History Narrative  . Not on file    There is no immunization history on file for this patient.   Objective: Vital Signs: There were no vitals taken for this visit.   Physical  Exam   Musculoskeletal Exam: ***  CDAI Exam: CDAI Score: -- Patient Global: --; Provider Global: -- Swollen: --; Tender: -- Joint Exam 07/20/2020   No joint exam has been documented for this visit   There is currently no information documented on the homunculus. Go to the Rheumatology activity and complete the homunculus joint exam.  Investigation: No additional findings.  Imaging: No results found.  Recent Labs: Lab Results  Component Value Date   WBC 4.3 04/01/2020   HGB 14.1 04/01/2020   PLT 233 04/01/2020   NA 139 04/01/2020   K 4.5 04/01/2020   CL 104 04/01/2020   CO2 27 04/01/2020   GLUCOSE 97 04/01/2020   BUN 15 04/01/2020   CREATININE 0.84 04/01/2020   BILITOT 0.7 04/01/2020   ALKPHOS 62 06/13/2017   AST 14 04/01/2020   ALT 15 04/01/2020   PROT 6.7 04/01/2020   ALBUMIN 4.1 06/13/2017   CALCIUM 9.3 04/01/2020   GFRAA 86 04/01/2020   QFTBGOLDPLUS NEGATIVE 04/01/2020    Speciality Comments: No specialty comments available.  Procedures:  No procedures performed Allergies: Patient has no known allergies.   Assessment / Plan:     Visit Diagnoses: No diagnosis found.  Orders: No orders of the defined types were placed in this encounter.  No orders of the defined types were placed in this encounter.   Face-to-face time spent with patient was *** minutes. Greater than 50% of time was spent in counseling and coordination of care.  Follow-Up  Instructions: No follow-ups on file.   Earnestine Mealing, CMA  Note - This record has been created using Editor, commissioning.  Chart creation errors have been sought, but may not always  have been located. Such creation errors do not reflect on  the standard of medical care.

## 2020-07-17 NOTE — Progress Notes (Signed)
Office Visit Note  Patient: Karen Bryant             Date of Birth: 02/13/1956           MRN: [id number]             PCP: Kirstie Peri, MD Referring: Kirstie Peri, MD Visit Date: 07/28/2020 Occupation: @GUAROCC @  Subjective:  Bunion left foot  History of Present Illness: Karen Bryant is a 64 y.o. female with history of psoriatic arthritis and osteoarthritis.  Patient is on Humira 40 mg subcutaneous injections every 14 days.  She has not missed any doses of Humira recently.  She has not missed any doses of Humira recently.  She denies any recent psoriatic arthritis flares.  She is not experiencing any joint pain or joint swelling at this time.  She has no Achilles tendinitis or plantar fasciitis.  She denies any SI joint pain.  She states that she has noticed that her left great toe has started to ambulate in her bunion flares up at times.  She states that she has been trying to wear proper fitting shoes while at work which seemed to help.  She denies any discomfort in her feet at this time. She denies any active psoriasis.  She has not seen a dermatologist recently. She denies any recent infections.  She has received both COVID-19 vaccinations and plans on receiving her third dose.  She also plans on receiving the annual influenza vaccine. She had a DEXA ordered by her PCP recently.  She is taking calcium and vitamin D supplements as recommended.  Activities of Daily Living:  Patient reports morning stiffness for 20 minutes.   Patient Denies nocturnal pain.  Difficulty dressing/grooming: Denies Difficulty climbing stairs: Denies Difficulty getting out of chair: Denies Difficulty using hands for taps, buttons, cutlery, and/or writing: Denies  Review of Systems  Constitutional: Negative for fatigue.  HENT: Negative for mouth sores, mouth dryness and nose dryness.   Eyes: Negative for pain, visual disturbance and dryness.  Respiratory: Negative for cough, hemoptysis, shortness of  breath and difficulty breathing.   Cardiovascular: Negative for chest pain, palpitations, hypertension and swelling in legs/feet.  Gastrointestinal: Negative for blood in stool, constipation and diarrhea.  Endocrine: Negative for excessive thirst and increased urination.  Genitourinary: Negative for difficulty urinating and painful urination.  Musculoskeletal: Positive for arthralgias, joint pain, joint swelling and morning stiffness. Negative for myalgias, muscle weakness, muscle tenderness and myalgias.  Skin: Negative for color change, pallor, rash, hair loss, nodules/bumps, skin tightness, ulcers and sensitivity to sunlight.  Allergic/Immunologic: Negative for susceptible to infections.  Neurological: Negative for dizziness, numbness, headaches and weakness.  Hematological: Negative for bruising/bleeding tendency and swollen glands.  Psychiatric/Behavioral: Negative for depressed mood and sleep disturbance. The patient is not nervous/anxious.     PMFS History:  Patient Active Problem List   Diagnosis Date Noted  . High risk medication use 01/20/2017  . 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 Essential hypertension 01/20/2017  . History of depression 01/20/2017  . Psoriatic arthropathy (HCC) 01/16/2017  . Psoriasis 01/16/2017  . Primary osteoarthritis of both feet 01/16/2017  . Primary osteoarthritis of both hands 01/16/2017  . Primary osteoarthritis of both knees 01/16/2017    Past Medical History:  Diagnosis Date  . Psoriatic arthritis (HCC)     Family History  Problem Relation Age of Onset  . Alzheimer's disease Mother   . Heart failure Mother   . Parkinson's disease Father   . Cancer Father  prostate  . Cancer Sister        breast cancer   . Healthy Daughter   . Healthy Daughter    Past Surgical History:  Procedure Laterality Date  . TUBAL LIGATION     Social History   Social History Narrative  . Not on file   Immunization History  Administered Date(s)  Administered  . Moderna SARS-COVID-2 Vaccination 01/09/2020, 02/08/2020     Objective: Vital Signs: BP 116/67 (BP Location: Left Arm, Patient Position: Sitting, Cuff Size: Normal)   Pulse 60   Resp 16   Ht 5\' 5"  (1.651 m)   Wt 188 lb 6.4 oz (85.5 kg)   BMI 31.35 kg/m    Physical Exam Vitals and nursing note reviewed.  Constitutional:      Appearance: She is well-developed.  HENT:     Head: Normocephalic and atraumatic.  Eyes:     Conjunctiva/sclera: Conjunctivae normal.  Pulmonary:     Effort: Pulmonary effort is normal.  Abdominal:     Palpations: Abdomen is soft.  Musculoskeletal:     Cervical back: Normal range of motion.  Skin:    General: Skin is warm and dry.     Capillary Refill: Capillary refill takes less than 2 seconds.  Neurological:     Mental Status: She is alert and oriented to person, place, and time.  Psychiatric:        Behavior: Behavior normal.      Musculoskeletal Exam: C-spine, thoracic spine, and lumbar spine have good range of motion with no discomfort.  No midline spinal tenderness.  No SI joint tenderness.  Shoulder joints and elbow joints have good range of motion with no discomfort.  Wrist joints, MCPs, PIPs, and DIPs good ROM with no synovitis.  PIP and DIP thickening consistent with osteoarthritis of both hands.  Hip joints good ROM with no discomfort.  Knee joints good ROM with no warmth or effusion.  Ankle joints good ROM with no tenderness of inflammation.  Left 1st MTP subluxation and bunion noted.  PIP and DIP thickening consistent with osteoarthritis of both feet. No tenderness along the achilles tendon or plantar fascia.   CDAI Exam: CDAI Score: 0.4  Patient Global: 2 mm; Provider Global: 2 mm Swollen: 0 ; Tender: 0  Joint Exam 07/28/2020   No joint exam has been documented for this visit   There is currently no information documented on the homunculus. Go to the Rheumatology activity and complete the homunculus joint  exam.  Investigation: No additional findings.  Imaging: No results found.  Recent Labs: Lab Results  Component Value Date   WBC 4.3 04/01/2020   HGB 14.1 04/01/2020   PLT 233 04/01/2020   NA 139 04/01/2020   K 4.5 04/01/2020   CL 104 04/01/2020   CO2 27 04/01/2020   GLUCOSE 97 04/01/2020   BUN 15 04/01/2020   CREATININE 0.84 04/01/2020   BILITOT 0.7 04/01/2020   ALKPHOS 62 06/13/2017   AST 14 04/01/2020   ALT 15 04/01/2020   PROT 6.7 04/01/2020   ALBUMIN 4.1 06/13/2017   CALCIUM 9.3 04/01/2020   GFRAA 86 04/01/2020   QFTBGOLDPLUS NEGATIVE 04/01/2020    Speciality Comments: No specialty comments available.  Procedures:  No procedures performed Allergies: Patient has no known allergies.   Assessment / Plan:     Visit Diagnoses: Psoriatic arthropathy (HCC): She has no synovitis or dactylitis on exam.  She has not had any recent psoriatic arthritis flares.  She is clinically doing  well on Humira 40 mg subcutaneous injections every 14 days.  She has not missed any doses of Humira recently.  She is not experiencing any joint pain or inflammation at this time.  She continues to have morning stiffness lasting for 20 minutes every morning.  X-rays of both hands and feet were obtained on 04/20/2020.  Possible erosive change noted on the right ulnar styloid.  She did not have any other interval changes when compared to x-rays from 2018.  Her left first MTP joint is subluxed she has some tenderness over the bunion.  We discussed the importance of wearing proper fitting shoes.  She declined a referral to podiatry at this time.  She will continue on Humira 40 mg subcutaneous injections every 14 days.  She does not need any refills at this time.  She was advised to notify us if she develops increased joint pain or joint swelling.  She will follow-up in the office in 5 months.  Psoriasis: She has no active psoriasis at this time.   High risk medication use - Humira 40 mg sq injections once  every 14 days.  CBC and CMP were within normal limits on 04/01/2020.  She is due to update lab work today.  Her next labs will be due in January and every 3 months to monitor for drug toxicity.  Standing orders for CBC and CMP are in place.  Orders for CBC and CMP were released.  TB gold was negative on 04/01/2020 and will continue to be monitored yearly.- Plan: COMPLETE METABOLIC PANEL WITH GFR, CBC with Differential/Platelet She has not had any recent infections.  We discussed the importance of holding Humira if she develops signs or symptoms of an infection and to resume once the infection has completely cleared.  She has received both COVID-19 vaccinations and plans on receiving a third dose.  She is also planning on receiving the annual influenza vaccine. She is advised to schedule appointment with her dermatologist for yearly skin exam due to the increased risk of skin cancer while on Humira.  Medial epicondylitis of elbow, right: Resolved.  Primary osteoarthritis of both hands: She has PIP and DIP thickening consistent with osteoarthritis of both hands.  No tenderness or inflammation was noted.  She is able to make a complete fist bilaterally.  Joint protection and muscle strengthening were discussed.  Primary osteoarthritis of both knees: She has good range of motion in both knee joints on exam.  No warmth or effusion was noted.  Primary osteoarthritis of both feet: She has PIP and DIP thickening consistent with osteoarthritis of both feet.  Subluxation and bunion formation at the left first MTP joint.  We discussed the importance of wearing proper fitting shoes.  X-rays of both feet were obtained on 04/20/2020 and were reviewed with the patient today in the office.  She is not experiencing any discomfort in her feet at this time.  No inflammation was noted on exam.  She was advised to notify us if she develops increased joint pain and we can refer her to a podiatrist.  DDD (degenerative disc  disease), lumbar: She is not experiencing any discomfort in her lower back at this time.  She has good range of motion with no midline spinal tenderness.  No symptoms of radiculopathy.  Trochanteric bursitis, left hip: Resolved. She has good ROM of the left hip with no discomfort.  No tenderness to palpation.   History of hypertension: BP was 116/67 today in the office.  Other fatigue: She is not experiencing any fatigue at this time.   Vitamin D deficiency -She is taking vitamin D 1,000 units daily.  Vitamin D was 16 on 07/04/2019.  We will recheck vitamin D level today.  According to the patient she had a recent bone density ordered by her PCP.  She has been taking calcium and vitamin D as recommended.  Plan: VITAMIN D 25 Hydroxy (Vit-D Deficiency, Fractures)  Orders: Orders Placed This Encounter  Procedures  . COMPLETE METABOLIC PANEL WITH GFR  . CBC with Differential/Platelet  . VITAMIN D 25 Hydroxy (Vit-D Deficiency, Fractures)   No orders of the defined types were placed in this encounter.     Follow-Up Instructions: Return in about 5 months (around 12/26/2020) for Psoriatic arthritis, Osteoarthritis.   Gearldine Bienenstock, PA-C  Note - This record has been created using Dragon software.  Chart creation errors have been sought, but may not always  have been located. Such creation errors do not reflect on  the standard of medical care.

## 2020-07-20 ENCOUNTER — Ambulatory Visit: Payer: BC Managed Care – PPO | Admitting: Physician Assistant

## 2020-07-22 MED FILL — HUMIRA PEN 40 MG/0.4ML PNKT: 40 | 28 days supply | Qty: 2 | Fill #1

## 2020-07-28 ENCOUNTER — Encounter: Payer: Self-pay | Admitting: Physician Assistant

## 2020-07-28 ENCOUNTER — Ambulatory Visit: Payer: BC Managed Care – PPO | Admitting: Physician Assistant

## 2020-07-28 ENCOUNTER — Other Ambulatory Visit: Payer: Self-pay

## 2020-07-28 VITALS — BP 116/67 | HR 60 | Resp 16 | Ht 65.0 in | Wt 188.4 lb

## 2020-07-28 DIAGNOSIS — Z79899 Other long term (current) drug therapy: Secondary | ICD-10-CM

## 2020-07-28 DIAGNOSIS — L4050 Arthropathic psoriasis, unspecified: Secondary | ICD-10-CM

## 2020-07-28 DIAGNOSIS — L409 Psoriasis, unspecified: Secondary | ICD-10-CM | POA: Diagnosis not present

## 2020-07-28 DIAGNOSIS — M7701 Medial epicondylitis, right elbow: Secondary | ICD-10-CM

## 2020-07-28 DIAGNOSIS — M7062 Trochanteric bursitis, left hip: Secondary | ICD-10-CM

## 2020-07-28 DIAGNOSIS — M5136 Other intervertebral disc degeneration, lumbar region: Secondary | ICD-10-CM

## 2020-07-28 DIAGNOSIS — M19072 Primary osteoarthritis, left ankle and foot: Secondary | ICD-10-CM

## 2020-07-28 DIAGNOSIS — E559 Vitamin D deficiency, unspecified: Secondary | ICD-10-CM

## 2020-07-28 DIAGNOSIS — M170 Bilateral primary osteoarthritis of knee: Secondary | ICD-10-CM

## 2020-07-28 DIAGNOSIS — R5383 Other fatigue: Secondary | ICD-10-CM

## 2020-07-28 DIAGNOSIS — M19041 Primary osteoarthritis, right hand: Secondary | ICD-10-CM

## 2020-07-28 DIAGNOSIS — M19071 Primary osteoarthritis, right ankle and foot: Secondary | ICD-10-CM

## 2020-07-28 DIAGNOSIS — Z8679 Personal history of other diseases of the circulatory system: Secondary | ICD-10-CM

## 2020-07-28 DIAGNOSIS — M19042 Primary osteoarthritis, left hand: Secondary | ICD-10-CM

## 2020-07-28 NOTE — Patient Instructions (Addendum)
Aquaphor     COVID-19 vaccine recommendations:   COVID-19 vaccine is recommended for everyone (unless you are allergic to a vaccine component), even if you are on a medication that suppresses your immune system.   If you are on Methotrexate, Cellcept (mycophenolate), Rinvoq, Harriette Ohara, and Olumiant- hold the medication for 1 week after each vaccine. Hold Methotrexate for 2 weeks after the single dose COVID-19 vaccine.   If you are on Orencia subcutaneous injection - hold medication one week prior to and one week after the first COVID-19 vaccine dose (only).   If you are on Orencia IV infusions- time vaccination administration so that the first COVID-19 vaccination will occur four weeks after the infusion and postpone the subsequent infusion by one week.   If you are on Cyclophosphamide or Rituxan infusions please contact your doctor prior to receiving the COVID-19 vaccine.   Do not take Tylenol or any anti-inflammatory medications (NSAIDs) 24 hours prior to the COVID-19 vaccination.   There is no direct evidence about the efficacy of the COVID-19 vaccine in individuals who are on medications that suppress the immune system.   Even if you are fully vaccinated, and you are on any medications that suppress your immune system, please continue to wear a mask, maintain at least six feet social distance and practice hand hygiene.   If you develop a COVID-19 infection, please contact your PCP or our office to determine if you need antibody infusion.  The booster vaccine is now available for immunocompromised patients. It is advised that if you had Pfizer vaccine you should get [employer] booster.  If you had a Moderna vaccine then you should get a Moderna booster. Johnson and Laural [employer] does not have a booster vaccine at this time.  Please see the following web sites for updated information.    [email or web address]://[email or web address]/[email or web address]/[email or web address]-Vaccination-Patient-Resources.[email or web address]  [email or web address]://[email or web address]/About-Us/Newsroom/Press-Releases/ID/1159  Standing Labs We placed an order today for your standing lab work.   Please have your standing labs drawn in January and every 3 months   If possible, please have your labs drawn 2 weeks prior to your appointment so that the provider can discuss your results at your appointment.  We have open lab daily Monday through Thursday from 8:30-12:30 PM and 1:30-4:30 PM and Friday from 8:30-12:30 PM and 1:30-4:00 PM at the office of Dr. Pollyann Savoy, [facility].   Please be advised, patients with office appointments requiring lab work will take precedents over walk-in lab work.  If possible, please come for your lab work on Monday and Friday afternoons, as you may experience shorter wait times. The office is located at [address], [address], [place], [place] [address] No appointment is necessary.   Labs are drawn by [facility]. Please bring your co-pay at the time of your lab draw.  You may receive a bill from [facility] for your lab work.  If you wish to have your labs drawn at another location, please call the office 24 hours in advance to send orders.  If you have any questions regarding directions or hours of operation,  please call [phone].   As a reminder, please drink plenty of water prior to coming for your lab work. Thanks!

## 2020-07-29 LAB — COMPLETE METABOLIC PANEL WITH GFR
AG Ratio: 2 (calc) (ref 1.0–2.5)
ALT: 15 U/L (ref 6–29)
AST: 16 U/L (ref 10–35)
Albumin: 4.2 g/dL (ref 3.6–5.1)
Alkaline phosphatase (APISO): 65 U/L (ref 37–153)
BUN/Creatinine Ratio: 9 (calc) (ref 6–22)
BUN: 5 mg/dL — ABNORMAL LOW (ref 7–25)
CO2: 26 mmol/L (ref 20–32)
Calcium: 9.2 mg/dL (ref 8.6–10.4)
Chloride: 104 mmol/L (ref 98–110)
Creat: 0.56 mg/dL (ref 0.50–0.99)
GFR, Est African American: 115 mL/min/{1.73_m2} (ref >=60)
GFR, Est Non African American: 99 mL/min/{1.73_m2} (ref >=60)
Globulin: 2.1 g/dL (calc) (ref 1.9–3.7)
Glucose, Bld: 89 mg/dL (ref 65–99)
Potassium: 4.3 mmol/L (ref 3.5–5.3)
Sodium: 137 mmol/L (ref 135–146)
Total Bilirubin: 1.2 mg/dL (ref 0.2–1.2)
Total Protein: 6.3 g/dL (ref 6.1–8.1)

## 2020-07-29 LAB — CBC WITH DIFFERENTIAL/PLATELET
Absolute Monocytes: 400 cells/uL (ref 200–950)
Basophils Absolute: 50 cells/uL (ref 0–200)
Basophils Relative: 1.4 %
Eosinophils Absolute: 50 cells/uL (ref 15–500)
Eosinophils Relative: 1.4 %
HCT: 42.9 % (ref 35.0–45.0)
Hemoglobin: 14.4 g/dL (ref 11.7–15.5)
Lymphs Abs: 1526 cells/uL (ref 850–3900)
MCH: 29.4 pg (ref 27.0–33.0)
MCHC: 33.6 g/dL (ref 32.0–36.0)
MCV: 87.7 fL (ref 80.0–100.0)
MPV: 11.3 fL (ref 7.5–12.5)
Monocytes Relative: 11.1 %
Neutro Abs: 1573 cells/uL (ref 1500–7800)
Neutrophils Relative %: 43.7 %
Platelets: 193 10*3/uL (ref 140–400)
RBC: 4.89 10*6/uL (ref 3.80–5.10)
RDW: 14.1 % (ref 11.0–15.0)
Total Lymphocyte: 42.4 %
WBC: 3.6 10*3/uL — ABNORMAL LOW (ref 3.8–10.8)

## 2020-07-29 LAB — VITAMIN D 25 HYDROXY (VIT D DEFICIENCY, FRACTURES): Vit D, 25-Hydroxy: 28 ng/mL — ABNORMAL LOW (ref 30–100)

## 2020-07-29 NOTE — Progress Notes (Signed)
BUN is low.  Rest of CMP WNL.  WBC count is low-3.6.  rest of CBC WNL. please notify the patient and advise the patient to have CBC with diff rechecked in 1 month.   Vitamin D is low.  Please notify the patient and send in vitamin D 50,000 units by mouth once weekly x3 months.  Recheck vitamin D in 3 months.

## 2020-07-30 ENCOUNTER — Other Ambulatory Visit: Payer: Self-pay | Admitting: *Deleted

## 2020-07-30 DIAGNOSIS — E559 Vitamin D deficiency, unspecified: Secondary | ICD-10-CM

## 2020-07-30 MED ORDER — VITAMIN D (ERGOCALCIFEROL) 1.25 MG (50000 UNIT) PO CAPS: 50000.0000 [IU] | ORAL_CAPSULE | ORAL | 0 refills | Status: DC

## 2020-07-30 NOTE — Telephone Encounter (Signed)
-----   Message from Gearldine Bienenstock, PA-C sent at 07/29/2020  1:11 PM EDT ----- BUN is low.  Rest of CMP WNL.  WBC count is low-3.6.  rest of CBC WNL. please notify the patient and advise the patient to have CBC with diff rechecked in 1 month.   Vitamin D is low.  Please notify the patient and send in vitamin D 50,000 units by mouth once weekly x3 months.  Recheck vitamin D in 3 months.

## 2020-08-18 MED FILL — HUMIRA PEN 40 MG/0.4ML PNKT: 40 | 28 days supply | Qty: 2 | Fill #2

## 2020-08-27 ENCOUNTER — Telehealth: Payer: Self-pay

## 2020-08-27 DIAGNOSIS — Z79899 Other long term (current) drug therapy: Secondary | ICD-10-CM

## 2020-08-27 NOTE — Telephone Encounter (Signed)
Patient called requesting labwork orders be sent to [facility] in [place] on [address].  Patient states she will be going tomorrow morning 08/28/20.

## 2020-08-27 NOTE — Telephone Encounter (Signed)
Lab Orders released.  

## 2020-08-28 LAB — CBC WITH DIFFERENTIAL/PLATELET
Absolute Monocytes: 306 cells/uL (ref 200–950)
Basophils Absolute: 51 cells/uL (ref 0–200)
Basophils Relative: 1.5 %
Eosinophils Absolute: 41 cells/uL (ref 15–500)
Eosinophils Relative: 1.2 %
HCT: 42.4 % (ref 35.0–45.0)
Hemoglobin: 14.2 g/dL (ref 11.7–15.5)
Lymphs Abs: 1306 cells/uL (ref 850–3900)
MCH: 29.8 pg (ref 27.0–33.0)
MCHC: 33.5 g/dL (ref 32.0–36.0)
MCV: 88.9 fL (ref 80.0–100.0)
MPV: 10.7 fL (ref 7.5–12.5)
Monocytes Relative: 9 %
Neutro Abs: 1697 cells/uL (ref 1500–7800)
Neutrophils Relative %: 49.9 %
Platelets: 248 10*3/uL (ref 140–400)
RBC: 4.77 10*6/uL (ref 3.80–5.10)
RDW: 13.1 % (ref 11.0–15.0)
Total Lymphocyte: 38.4 %
WBC: 3.4 10*3/uL — ABNORMAL LOW (ref 3.8–10.8)

## 2020-08-28 NOTE — Telephone Encounter (Signed)
WBC count is low-3.4-trending down. rest of CBC WNL. Reviewed with Dr. Corliss Skains.  Please advise the patient to space humira to every 21 days.

## 2020-09-14 ENCOUNTER — Other Ambulatory Visit: Payer: Self-pay | Admitting: Rheumatology

## 2020-09-14 DIAGNOSIS — L4050 Arthropathic psoriasis, unspecified: Secondary | ICD-10-CM

## 2020-09-15 ENCOUNTER — Other Ambulatory Visit: Payer: Self-pay | Admitting: Physician Assistant

## 2020-09-15 NOTE — Telephone Encounter (Signed)
Last Visit: 07/28/2020 Next Visit: 12/29/2020 Labs: 07/28/2020 BUN is low. Rest of CMP WNL. WBC count is low-3.6. rest of CBC WNL. 08/28/2020 WBC count is low-3.4-trending down. rest of CBC WNL.  TB Gold: 04/01/2020 Neg   Current Dose per office note 07/28/2020: Humira 40 mg sq injections once every 14 days. Per lab note on 08/28/2020 Patient advised to space humira to every 21 days.   DX: Psoriatic arthropathy   Okay to refill Humira?

## 2020-09-24 ENCOUNTER — Telehealth: Payer: Self-pay | Admitting: Pharmacist

## 2020-09-24 MED FILL — HUMIRA PEN 40 MG/0.4ML PNKT: 40 | 21 days supply | Qty: 1 | Fill #0

## 2020-09-24 NOTE — Telephone Encounter (Signed)
Received notification from [facility] regarding a prior authorization for HUMIRA. Authorization has been APPROVED from 06/09/19 through 06/02/22.  Authorization # [id number] Phone # [phone]  Chesley Mires, PharmD, MPH Clinical Pharmacist (Rheumatology and Pulmonology)

## 2020-10-22 MED FILL — HUMIRA PEN 40 MG/0.4ML PNKT: 40 | 21 days supply | Qty: 1 | Fill #1

## 2020-11-12 ENCOUNTER — Other Ambulatory Visit: Payer: Self-pay | Admitting: Physician Assistant

## 2020-11-12 ENCOUNTER — Other Ambulatory Visit: Payer: Self-pay | Admitting: Rheumatology

## 2020-11-12 DIAGNOSIS — L4050 Arthropathic psoriasis, unspecified: Secondary | ICD-10-CM

## 2020-11-12 MED FILL — HUMIRA PEN 40 MG/0.4ML PNKT: 40 | 21 days supply | Qty: 1 | Fill #0

## 2020-11-12 NOTE — Telephone Encounter (Signed)
LMOM lab work due this month.

## 2020-11-12 NOTE — Telephone Encounter (Signed)
Please advise patient to come in for lab work this month.

## 2020-11-12 NOTE — Telephone Encounter (Signed)
Last Visit: 07/28/2020 Next Visit: 12/29/2020 Labs: 08/28/2020, WBC count is low-3.4-trending down. rest of CBC WNL. Reviewed with Dr. Corliss Skains.  Please advise the patient to space humira to every 21 days.  TB Gold: 04/01/2020, negative  Current Dose per office note 07/28/2020, Humira 40 mg sq injections once every 14 days  TR:[id number] arthropathy   Okay to refill Humira?

## 2020-11-16 ENCOUNTER — Other Ambulatory Visit: Payer: Self-pay | Admitting: *Deleted

## 2020-11-16 ENCOUNTER — Telehealth: Payer: Self-pay

## 2020-11-16 DIAGNOSIS — Z79899 Other long term (current) drug therapy: Secondary | ICD-10-CM

## 2020-11-16 DIAGNOSIS — E559 Vitamin D deficiency, unspecified: Secondary | ICD-10-CM

## 2020-11-16 NOTE — Telephone Encounter (Signed)
Lab Orders released.  

## 2020-11-16 NOTE — Telephone Encounter (Signed)
Patient called requesting her labwork orders be sent to [facility] in [place].  Patient will be going tomorrow 11/17/20.

## 2020-11-17 NOTE — Progress Notes (Signed)
CBC WNL

## 2020-11-18 LAB — CBC WITH DIFFERENTIAL/PLATELET
Absolute Monocytes: 524 cells/uL (ref 200–950)
Basophils Absolute: 61 cells/uL (ref 0–200)
Basophils Relative: 0.9 %
Eosinophils Absolute: 41 cells/uL (ref 15–500)
Eosinophils Relative: 0.6 %
HCT: 42.6 % (ref 35.0–45.0)
Hemoglobin: 14.2 g/dL (ref 11.7–15.5)
Lymphs Abs: 1584 cells/uL (ref 850–3900)
MCH: 30.3 pg (ref 27.0–33.0)
MCHC: 33.3 g/dL (ref 32.0–36.0)
MCV: 90.8 fL (ref 80.0–100.0)
MPV: 10.8 fL (ref 7.5–12.5)
Monocytes Relative: 7.7 %
Neutro Abs: 4590 cells/uL (ref 1500–7800)
Neutrophils Relative %: 67.5 %
Platelets: 197 10*3/uL (ref 140–400)
RBC: 4.69 10*6/uL (ref 3.80–5.10)
RDW: 12.9 % (ref 11.0–15.0)
Total Lymphocyte: 23.3 %
WBC: 6.8 10*3/uL (ref 3.8–10.8)

## 2020-11-18 LAB — COMPLETE METABOLIC PANEL WITH GFR
AG Ratio: 1.8 (calc) (ref 1.0–2.5)
ALT: 19 U/L (ref 6–29)
AST: 16 U/L (ref 10–35)
Albumin: 4.1 g/dL (ref 3.6–5.1)
Alkaline phosphatase (APISO): 65 U/L (ref 37–153)
BUN: 17 mg/dL (ref 7–25)
CO2: 27 mmol/L (ref 20–32)
Calcium: 9 mg/dL (ref 8.6–10.4)
Chloride: 104 mmol/L (ref 98–110)
Creat: 0.66 mg/dL (ref 0.50–0.99)
GFR, Est African American: 109 mL/min/{1.73_m2} (ref >=60)
GFR, Est Non African American: 94 mL/min/{1.73_m2} (ref >=60)
Globulin: 2.3 g/dL (calc) (ref 1.9–3.7)
Glucose, Bld: 87 mg/dL (ref 65–139)
Potassium: 4.7 mmol/L (ref 3.5–5.3)
Sodium: 138 mmol/L (ref 135–146)
Total Bilirubin: 0.7 mg/dL (ref 0.2–1.2)
Total Protein: 6.4 g/dL (ref 6.1–8.1)

## 2020-11-18 LAB — VITAMIN D 25 HYDROXY (VIT D DEFICIENCY, FRACTURES): Vit D, 25-Hydroxy: 34 ng/mL (ref 30–100)

## 2020-11-18 NOTE — Progress Notes (Signed)
Vitamin D WNL-34.  Please notify the patient and advise the patient to continue a maintenance dose of vitamin D.   CMP WNL.

## 2020-12-09 MED FILL — HUMIRA PEN 40 MG/0.4ML PNKT: 40 | 21 days supply | Qty: 1 | Fill #1

## 2020-12-15 NOTE — Progress Notes (Signed)
Office Visit Note  Patient: Karen Bryant             Date of Birth: 11-02-1955           MRN: [id number]             PCP: Kirstie Peri, MD Referring: Kirstie Peri, MD Visit Date: 12/29/2020 Occupation: @GUAROCC @  Subjective:  Medication management.   History of Present Illness: Anitta Tenny is a 65 y.o. female history of psoriatic arthritis, psoriasis and osteoarthritis.  She has been taking Humira every 21 days.  Humira is working well for her.  She denies any joint swelling.  She denies any psoriasis lesions.  She states on March 5 she took a fall and landed up on her left rib cage.  She had some discomfort.  She was seen by her PCP who did x-rays and they were unremarkable.  Her symptoms have been gradually improving.  She finished the course of vitamin D and now has been taking vitamin D over-the-counter.   Activities of Daily Living:  Patient reports morning stiffness for 5 minutes.   Patient Denies nocturnal pain.  Difficulty dressing/grooming: Denies Difficulty climbing stairs: Denies Difficulty getting out of chair: Denies Difficulty using hands for taps, buttons, cutlery, and/or writing: Denies  Review of Systems  Constitutional: Positive for fatigue. Negative for night sweats, weight gain and weight loss.  HENT: Negative for mouth sores, trouble swallowing, trouble swallowing, mouth dryness and nose dryness.   Eyes: Negative for pain, redness, visual disturbance and dryness.  Respiratory: Negative for cough, shortness of breath and difficulty breathing.   Cardiovascular: Negative for chest pain, palpitations, hypertension, irregular heartbeat and swelling in legs/feet.  Gastrointestinal: Negative for blood in stool, constipation and diarrhea.  Endocrine: Negative for increased urination.  Genitourinary: Negative for vaginal dryness.  Musculoskeletal: Positive for morning stiffness. Negative for arthralgias, joint pain, joint swelling, myalgias, muscle weakness, muscle  tenderness and myalgias.  Skin: Negative for color change, rash, hair loss, skin tightness, ulcers and sensitivity to sunlight.  Allergic/Immunologic: Negative for susceptible to infections.  Neurological: Negative for dizziness, memory loss, night sweats and weakness.  Hematological: Negative for swollen glands.  Psychiatric/Behavioral: Negative for depressed mood and sleep disturbance. The patient is not nervous/anxious.     PMFS History:  Patient Active Problem List   Diagnosis Date Noted  . High risk medication use 01/20/2017  . 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 Essential hypertension 01/20/2017  . History of depression 01/20/2017  . Psoriatic arthropathy (HCC) 01/16/2017  . Psoriasis 01/16/2017  . Primary osteoarthritis of both feet 01/16/2017  . Primary osteoarthritis of both hands 01/16/2017  . Primary osteoarthritis of both knees 01/16/2017    Past Medical History:  Diagnosis Date  . Psoriatic arthritis (HCC)     Family History  Problem Relation Age of Onset  . Alzheimer's disease Mother   . Heart failure Mother   . Parkinson's disease Father   . Cancer Father        prostate  . Cancer Sister        breast cancer   . Healthy Daughter   . Healthy Daughter    Past Surgical History:  Procedure Laterality Date  . TUBAL LIGATION     Social History   Social History Narrative  . Not on file   Immunization History  Administered Date(s) Administered  . Moderna Sars-Covid-2 Vaccination 01/09/2020, 02/08/2020     Objective: Vital Signs: BP (!) 141/89 (BP Location: Left Arm, Patient Position: Sitting,  Cuff Size: Normal)   Pulse 61   Ht 5\' 5"  (1.651 m)   Wt 194 lb 3.2 oz (88.1 kg)   BMI 32.32 kg/m    Physical Exam Vitals and nursing note reviewed.  Constitutional:      Appearance: She is well-developed.  HENT:     Head: Normocephalic and atraumatic.  Eyes:     Conjunctiva/sclera: Conjunctivae normal.  Cardiovascular:     Rate and Rhythm: Normal rate and  regular rhythm.     Heart sounds: Normal heart sounds.  Pulmonary:     Effort: Pulmonary effort is normal.     Breath sounds: Normal breath sounds.  Abdominal:     General: Bowel sounds are normal.     Palpations: Abdomen is soft.  Musculoskeletal:     Cervical back: Normal range of motion.  Lymphadenopathy:     Cervical: No cervical adenopathy.  Skin:    General: Skin is warm and dry.     Capillary Refill: Capillary refill takes less than 2 seconds.  Neurological:     Mental Status: She is alert and oriented to person, place, and time.  Psychiatric:        Behavior: Behavior normal.      Musculoskeletal Exam: C-spine thoracic and lumbar spine were in good range of motion.  She had no SI joint tenderness.  Shoulder joints, elbow joints, wrist joints, MCPs PIPs and DIPs with good range of motion with no synovitis.  Hip joints, knee joints, ankles, MTPs and PIPs with good range of motion with no synovitis.  She had no evidence of plantar fasciitis or Achilles tendinitis.  CDAI Exam: CDAI Score: - Patient Global: -; Provider Global: - Swollen: -; Tender: - Joint Exam 12/29/2020   No joint exam has been documented for this visit   There is currently no information documented on the homunculus. Go to the Rheumatology activity and complete the homunculus joint exam.  Investigation: No additional findings.  Imaging: No results found.  Recent Labs: Lab Results  Component Value Date   WBC 6.8 11/17/2020   HGB 14.2 11/17/2020   PLT 197 11/17/2020   NA 138 11/17/2020   K 4.7 11/17/2020   CL 104 11/17/2020   CO2 27 11/17/2020   GLUCOSE 87 11/17/2020   BUN 17 11/17/2020   CREATININE 0.66 11/17/2020   BILITOT 0.7 11/17/2020   ALKPHOS 62 06/13/2017   AST 16 11/17/2020   ALT 19 11/17/2020   PROT 6.4 11/17/2020   ALBUMIN 4.1 06/13/2017   CALCIUM 9.0 11/17/2020   GFRAA 109 11/17/2020   QFTBGOLDPLUS NEGATIVE 04/01/2020    Speciality Comments: No specialty comments  available.  Procedures:  No procedures performed Allergies: Patient has no known allergies.   Assessment / Plan:     Visit Diagnoses: Psoriatic arthropathy (HCC)-she denies having any psoriatic arthritis flare.  Her symptoms are very well controlled on Humira 40 mg subcu every 21 days.  She has some morning stiffness.  She denies any history of plantar fasciitis or Achilles tendinitis.  There is no history of iritis or chest pain.  Psoriasis-she has no active psoriasis lesions.  High risk medication use - Humira 40 mg sq injections once every 21 days.  Her labs have been stable.  She will get labs in May and then every 3 months to monitor for drug toxicity.  TB gold will be due in June.  I have advised her to get TB Gold with her next labs.  She has been advised  to stop Humira in case she develops an infection and resume the medication after the infection is treated.  She is also received first 2 doses of COVID-19 vaccination.  Updated information regarding the COVID-19 vaccine was placed in the AVS.  Has been also advised to get pneumococcal vaccine and Shingrix vaccine if she has not received it yet.  Primary osteoarthritis of both hands-joint protection was discussed.  Primary osteoarthritis of both knees-she is currently not having much discomfort.  Primary osteoarthritis of both feet-she has bilateral hammertoes.  Proper fitting shoes were discussed.  DDD (degenerative disc disease), lumbar-she denies any lower back discomfort currently.  History of hypertension-her blood pressure is mildly elevated.  She has been advised to monitor blood pressure closely and follow-up with her PCP.  Increased risk of heart disease with psoriatic arthritis was discussed.  Dietary modifications and regular exercise was emphasized.  The handout was placed in the AVS.  She states she has been getting lipid panel through her PCP.  Vitamin D deficiency-she had vitamin D deficiency in the past.  She is taking  vitamin D supplement.  Other fatigue  Osteoporosis screening-patient states that she had DEXA scan done by her PCP last year which was normal.  Orders: Orders Placed This Encounter  Procedures  . QuantiFERON-TB Gold Plus   No orders of the defined types were placed in this encounter.     Follow-Up Instructions: Return in about 5 months (around 05/31/2021) for Psoriatic arthritis.   Pollyann Savoy, MD  Note - This record has been created using Animal nutritionist.  Chart creation errors have been sought, but may not always  have been located. Such creation errors do not reflect on  the standard of medical care.

## 2020-12-29 ENCOUNTER — Encounter: Payer: Self-pay | Admitting: Rheumatology

## 2020-12-29 ENCOUNTER — Ambulatory Visit (INDEPENDENT_AMBULATORY_CARE_PROVIDER_SITE_OTHER): Payer: BC Managed Care – PPO | Admitting: Rheumatology

## 2020-12-29 ENCOUNTER — Other Ambulatory Visit: Payer: Self-pay

## 2020-12-29 VITALS — BP 141/89 | HR 61 | Ht 65.0 in | Wt 194.2 lb

## 2020-12-29 DIAGNOSIS — M19071 Primary osteoarthritis, right ankle and foot: Secondary | ICD-10-CM

## 2020-12-29 DIAGNOSIS — Z79899 Other long term (current) drug therapy: Secondary | ICD-10-CM

## 2020-12-29 DIAGNOSIS — M19041 Primary osteoarthritis, right hand: Secondary | ICD-10-CM

## 2020-12-29 DIAGNOSIS — L4050 Arthropathic psoriasis, unspecified: Secondary | ICD-10-CM

## 2020-12-29 DIAGNOSIS — R5383 Other fatigue: Secondary | ICD-10-CM

## 2020-12-29 DIAGNOSIS — M19042 Primary osteoarthritis, left hand: Secondary | ICD-10-CM

## 2020-12-29 DIAGNOSIS — M7701 Medial epicondylitis, right elbow: Secondary | ICD-10-CM

## 2020-12-29 DIAGNOSIS — Z13820 Encounter for screening for osteoporosis: Secondary | ICD-10-CM

## 2020-12-29 DIAGNOSIS — M5136 Other intervertebral disc degeneration, lumbar region: Secondary | ICD-10-CM

## 2020-12-29 DIAGNOSIS — L409 Psoriasis, unspecified: Secondary | ICD-10-CM

## 2020-12-29 DIAGNOSIS — E559 Vitamin D deficiency, unspecified: Secondary | ICD-10-CM

## 2020-12-29 DIAGNOSIS — M19072 Primary osteoarthritis, left ankle and foot: Secondary | ICD-10-CM

## 2020-12-29 DIAGNOSIS — M170 Bilateral primary osteoarthritis of knee: Secondary | ICD-10-CM

## 2020-12-29 DIAGNOSIS — Z8679 Personal history of other diseases of the circulatory system: Secondary | ICD-10-CM

## 2020-12-29 DIAGNOSIS — M7062 Trochanteric bursitis, left hip: Secondary | ICD-10-CM

## 2020-12-29 NOTE — Patient Instructions (Signed)
Standing Labs We placed an order today for your standing lab work.   Please have your standing labs drawn in May and every 3 months  TB GOLD in May  If possible, please have your labs drawn 2 weeks prior to your appointment so that the provider can discuss your results at your appointment.  We have open lab daily Monday through Thursday from 1:30-4:30 PM and Friday from 1:30-4:00 PM at the office of Dr. Pollyann Savoy, [facility].   Please be advised, all patients with office appointments requiring lab work will take precedents over walk-in lab work.  If possible, please come for your lab work on Monday and Friday afternoons, as you may experience shorter wait times. The office is located at [address], [address], [place], [place] [address] No appointment is necessary.   Labs are drawn by [facility]. Please bring your co-pay at the time of your lab draw.  You may receive a bill from [facility] for your lab work.  If you wish to have your labs drawn at another location, please call the office 24 hours in advance to send orders.  If you have any questions regarding directions or hours of operation,  please call [phone].   As a reminder, please drink plenty of water prior to coming for your lab work. Thanks!  COVID-19 vaccine recommendations:   COVID-19 vaccine is recommended for everyone (unless you are allergic to a vaccine component), even if you are on a medication that suppresses your immune system.   The recommendations are that individuals on immunosuppressive therapy should receive first 3 COVID vaccines 1 month apart and then a fourth vaccine 3 to 5 months after the third dose.  Do not take Tylenol or any anti-inflammatory medications (NSAIDs) 24 hours prior to the COVID-19 vaccination.   There is no direct evidence about the efficacy of the COVID-19 vaccine in individuals who are on medications that suppress the immune system.   Even if you are fully  vaccinated, and you are on any medications that suppress your immune system, please continue to wear a mask, maintain at least six feet social distance and practice hand hygiene.   If you develop a COVID-19 infection, please contact your PCP or our office to determine if you need monoclonal antibody infusion.  The booster vaccine is now available for immunocompromised patients.   Please see the following web sites for updated information.   [email or web address]  Vaccines You are taking a medication(s) that can suppress your immune system.  The following immunizations are recommended:  Flu annually  Covid-19   Pneumonia (Pneumovax 23 and Prevnar 13 spaced at least 1 year apart)  Shingrix (after age 15)  Please check with your PCP to make sure you are up to date.  Heart Disease Prevention   Your inflammatory disease increases your risk of heart disease which includes heart attack, stroke, atrial fibrillation (irregular heartbeats), high blood pressure, heart failure and atherosclerosis (plaque in the arteries).  It is important to reduce your risk by:    Keep blood pressure, cholesterol, and blood sugar at healthy levels    Smoking Cessation    Maintain a healthy weight  o BMI 20-25    Eat a healthy diet  o Plenty of fresh fruit, vegetables, and whole grains  o Limit saturated fats, foods high in sodium, and added sugars  o DASH and Mediterranean diet    Increase physical activity  o Recommend moderate physically activity for 150 minutes per week/  30 minutes a day for five days a week These can be broken up into three separate ten-minute sessions during the day.    Reduce Stress   Meditation, slow breathing exercises, yoga, coloring books   Dental visits twice a year

## 2021-01-01 MED FILL — HUMIRA PEN 40 MG/0.4ML PNKT: 40 | 21 days supply | Qty: 1 | Fill #2

## 2021-01-07 ENCOUNTER — Other Ambulatory Visit (HOSPITAL_COMMUNITY): Payer: Self-pay

## 2021-01-20 ENCOUNTER — Other Ambulatory Visit (HOSPITAL_COMMUNITY): Payer: Self-pay

## 2021-01-20 MED FILL — Adalimumab Auto-injector Kit 40 MG/0.4ML: SUBCUTANEOUS | 21 days supply | Qty: 1 | Fill #0 | Status: CN

## 2021-01-21 ENCOUNTER — Other Ambulatory Visit (HOSPITAL_COMMUNITY): Payer: Self-pay

## 2021-01-21 MED FILL — Adalimumab Auto-injector Kit 40 MG/0.4ML: SUBCUTANEOUS | 21 days supply | Qty: 1 | Fill #0 | Status: AC

## 2021-02-11 ENCOUNTER — Other Ambulatory Visit: Payer: Self-pay | Admitting: Rheumatology

## 2021-02-11 ENCOUNTER — Other Ambulatory Visit (HOSPITAL_COMMUNITY): Payer: Self-pay

## 2021-02-11 DIAGNOSIS — L4050 Arthropathic psoriasis, unspecified: Secondary | ICD-10-CM

## 2021-02-11 MED ORDER — HUMIRA (2 PEN) 40 MG/0.4ML ~~LOC~~ AJKT
AUTO-INJECTOR | SUBCUTANEOUS | 1 refills | Status: DC
  Filled 2021-02-11: qty 1, 21d supply, fill #0
  Filled 2021-03-15: qty 1, 21d supply, fill #1
  Filled 2021-03-29: qty 1, 21d supply, fill #2
  Filled 2021-04-21: qty 1, 21d supply, fill #3

## 2021-02-11 NOTE — Telephone Encounter (Addendum)
Next Visit: 06/01/2021  Last Visit: 12/29/2020  Last Fill: 11/12/2020  DX: Psoriatic arthropathy  Current Dose per office note 12/29/2020, Humira 40 mg sq injections once every 21 days  Labs: 11/17/2020, CBC WNL, Vitamin D WNL-34. Please notify the patient and advise the patient to continue a maintenance dose of vitamin D.  CMP WNL.   TB Gold: 04/01/2021, negative  Okay to refill Humira?

## 2021-03-10 HISTORY — PX: CHOLECYSTECTOMY: SHX55

## 2021-03-15 ENCOUNTER — Other Ambulatory Visit (HOSPITAL_COMMUNITY): Payer: Self-pay

## 2021-03-29 ENCOUNTER — Other Ambulatory Visit (HOSPITAL_COMMUNITY): Payer: Self-pay

## 2021-04-01 ENCOUNTER — Other Ambulatory Visit (HOSPITAL_COMMUNITY): Payer: Self-pay

## 2021-04-19 ENCOUNTER — Other Ambulatory Visit (HOSPITAL_COMMUNITY): Payer: Self-pay

## 2021-04-21 ENCOUNTER — Other Ambulatory Visit (HOSPITAL_COMMUNITY): Payer: Self-pay

## 2021-04-22 ENCOUNTER — Other Ambulatory Visit (HOSPITAL_COMMUNITY): Payer: Self-pay

## 2021-05-17 ENCOUNTER — Other Ambulatory Visit: Payer: Self-pay | Admitting: Physician Assistant

## 2021-05-17 ENCOUNTER — Other Ambulatory Visit (HOSPITAL_COMMUNITY): Payer: Self-pay

## 2021-05-17 DIAGNOSIS — L4050 Arthropathic psoriasis, unspecified: Secondary | ICD-10-CM

## 2021-05-17 MED ORDER — HUMIRA (2 PEN) 40 MG/0.4ML ~~LOC~~ AJKT
AUTO-INJECTOR | SUBCUTANEOUS | 1 refills | Status: DC
  Filled 2021-05-17: qty 1, 21d supply, fill #0
  Filled 2021-06-15: qty 1, 21d supply, fill #1
  Filled 2021-07-26: qty 1, 21d supply, fill #2
  Filled 2021-10-07: qty 1, 21d supply, fill #3

## 2021-05-17 NOTE — Telephone Encounter (Signed)
Next Visit: 06/01/2021   Last Visit: 12/29/2020   Last Fill: 02/11/2021   DX: Psoriatic arthropathy   Current Dose per office note 12/29/2020, Humira 40 mg sq injections once every 21 days   Labs: 03/11/2021 BUN 7, Calcium 7.9, Albumin 3.1, AST 83, ALT 237  TB Gold: 04/01/2021, negative   Okay to refill Humira?

## 2021-05-18 NOTE — Progress Notes (Signed)
Office Visit Note  Patient: Karen Bryant             Date of Birth: December 08, 1955           MRN: [id number]             PCP: Kirstie Peri, MD Referring: Kirstie Peri, MD Visit Date: 06/01/2021 Occupation: @GUAROCC @  Subjective:  Right knee joint pain   History of Present Illness: Karen Bryant is a 65 y.o. female with history of psoriatic arthritis and DDD.  She is on humira 40 mg sq injections every 21 days.  She has been spacing Humira since November 2021 due to low white blood cell count.  Until the last couple of months she had been doing well spacing the dose of Humira.  She has been experiencing increased pain and stiffness in her right knee over the past few months.  She did not have any injury or fall prior to the onset of symptoms.  She is not experiencing mechanical symptoms.  She does have difficulty getting up from a seated position due to the pain and stiffness.  She has noticed some swelling in the right knee.  She denies any discomfort in her left knee.  She is not experiencing any joint pain or joint swelling at this time.  She has no SI joint discomfort.  No Achilles tendinitis or plantar fasciitis.  She has no active psoriasis at this time. She underwent a cholecystectomy on 03/10/2021.  She was unable to hold Humira prior to surgery since she presented with acute cholecystitis on 03/09/2021.  She did not have any complications.      Activities of Daily Living:  Patient reports morning stiffness for 20 minutes.   Patient Reports nocturnal pain.  Difficulty dressing/grooming: Denies Difficulty climbing stairs: Reports Difficulty getting out of chair: Reports Difficulty using hands for taps, buttons, cutlery, and/or writing: Reports  Review of Systems  Constitutional:  Negative for fatigue.  HENT:  Negative for mouth sores, mouth dryness and nose dryness.   Eyes:  Negative for pain, itching and dryness.  Respiratory:  Negative for shortness of breath and difficulty  breathing.   Cardiovascular:  Negative for chest pain and palpitations.  Gastrointestinal:  Negative for blood in stool, constipation and diarrhea.  Endocrine: Negative for increased urination.  Genitourinary:  Negative for difficulty urinating.  Musculoskeletal:  Positive for joint pain, joint pain, joint swelling and morning stiffness. Negative for myalgias, muscle tenderness and myalgias.  Skin:  Negative for color change, rash and redness.  Allergic/Immunologic: Negative for susceptible to infections.  Neurological:  Negative for dizziness, numbness, headaches, memory loss and weakness.  Hematological:  Positive for bruising/bleeding tendency.  Psychiatric/Behavioral:  Negative for confusion.    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   Essential hypertension 01/20/2017   History of depression 01/20/2017   Psoriatic arthropathy (HCC) 01/16/2017   Psoriasis 01/16/2017   Primary osteoarthritis of both feet 01/16/2017   Primary osteoarthritis of both hands 01/16/2017   Primary osteoarthritis of both knees 01/16/2017    Past Medical History:  Diagnosis Date   Psoriatic arthritis (HCC)     Family History  Problem Relation Age of Onset   Alzheimer's disease Mother    Heart failure Mother    Parkinson's disease Father    Cancer Father        prostate   Cancer Sister  breast cancer    Healthy Daughter    Healthy Daughter    Past Surgical History:  Procedure Laterality Date   CHOLECYSTECTOMY  03/10/2021   TUBAL LIGATION     Social History   Social History Narrative   Not on file   Immunization History  Administered Date(s) Administered   Moderna Sars-Covid-2 Vaccination 01/09/2020, 02/08/2020     Objective: Vital Signs: BP 118/77 (BP Location: Left Arm, Patient Position: Sitting, Cuff Size: Normal)   Pulse 72   Ht 5\' 5"  (1.651 m)   Wt 222 lb (100.7 kg)   BMI  36.94 kg/m    Physical Exam Vitals and nursing note reviewed.  Constitutional:      Appearance: She is well-developed.  HENT:     Head: Normocephalic and atraumatic.  Eyes:     Conjunctiva/sclera: Conjunctivae normal.  Pulmonary:     Effort: Pulmonary effort is normal.  Abdominal:     Palpations: Abdomen is soft.  Musculoskeletal:     Cervical back: Normal range of motion.  Skin:    General: Skin is warm and dry.     Capillary Refill: Capillary refill takes less than 2 seconds.  Neurological:     Mental Status: She is alert and oriented to person, place, and time.  Psychiatric:        Behavior: Behavior normal.     Musculoskeletal Exam: C-spine, thoracic spine, lumbar spine have good range of motion with no discomfort.  Shoulder joints, elbow joints, wrist joints, MCPs, PIPs, DIPs have good range of motion with no synovitis.  PIP and DIP thickening consistent with osteoarthritis of both hands noted.  Hip joints have slightly limited range of motion.  Warmth and swelling of the right knee noted.  Left knee has good range of motion with no warmth or effusion.  Ankle joints have good range of motion with no tenderness or joint swelling.  No evidence of Achilles tendinitis or plantar fasciitis.  CDAI Exam: CDAI Score: -- Patient Global: --; Provider Global: -- Swollen: --; Tender: -- Joint Exam 06/01/2021   No joint exam has been documented for this visit   There is currently no information documented on the homunculus. Go to the Rheumatology activity and complete the homunculus joint exam.  Investigation: No additional findings.  Imaging: No results found.  Recent Labs: Lab Results  Component Value Date   WBC 6.8 11/17/2020   HGB 14.2 11/17/2020   PLT 197 11/17/2020   NA 138 11/17/2020   K 4.7 11/17/2020   CL 104 11/17/2020   CO2 27 11/17/2020   GLUCOSE 87 11/17/2020   BUN 17 11/17/2020   CREATININE 0.66 11/17/2020   BILITOT 0.7 11/17/2020   ALKPHOS 62  06/13/2017   AST 16 11/17/2020   ALT 19 11/17/2020   PROT 6.4 11/17/2020   ALBUMIN 4.1 06/13/2017   CALCIUM 9.0 11/17/2020   GFRAA 109 11/17/2020   QFTBGOLDPLUS NEGATIVE 04/01/2020    Speciality Comments: No specialty comments available.  Procedures:  No procedures performed Allergies: Patient has no known allergies.   Assessment / Plan:     Visit Diagnoses: Psoriatic arthropathy (HCC) -She presents today with pain, warmth, and swelling of the right knee joint which started 2 to 3 months ago.  No injury prior to the onset of symptoms.  She has been injecting Humira 40 mg subcutaneously every 21 days since November 2021 due to low white blood cell count.  Overall she had not noticed any increased joint pain or stiffness  while spacing the dose of Humira until recently.  She is not experiencing any other joint pain or inflammation at this time.  No evidence of Achilles tendinitis or plantar fasciitis was noted.  She has no SI joint tenderness or stiffness at this time.  X-rays of the right knee were updated today.  She declined a right knee joint cortisone injection at this time.  Conservative treatment options were discussed today.  We will also check a sed rate and her routine CBC and CMP.  She has history of elevated LFTs so she was advised to avoid taking NSAIDs and Tylenol until her lab work has resulted.  We discussed increasing the frequency of Humira to every 18 days since her white blood cell count has returned to within normal limits (WBC count 8.4 on 03/11/21).I will wait to change the dose of Humira pending CBC with differential today.  She was advised to notify us if her right knee joint pain persists or worsens.  She will follow-up in the office in 3 months.  Plan: Sedimentation rate  Psoriasis: She has no active psoriasis at this time.  High risk medication use - Humira 40 mg sq injections once every 21 days.  Discussed increasing the frequency of humira to every 18 days pending CBC  with diff.  CBC and CMP drawn on 03/11/21.  CBC and CMP will be updated today.  Orders were released.  TB gold negative on 04/01/20.  She is overdue to update TB gold.  Order released. - Plan: QuantiFERON-TB Gold Plus, CBC with Differential/Platelet, COMPLETE METABOLIC PANEL WITH GFR She presented to the ED on 03/09/2021 with acute cholecystitis.  She underwent laparoscopic cholecystectomy on 03/10/2021.  No complications.  She was unable to hold Humira due to the urgency of the surgery. We discussed the importance of holding Humira if she develops signs or symptoms of an infection and to resume once infection has completely cleared.  Screening for tuberculosis -Order for TB gold released today.  Plan: QuantiFERON-TB Gold Plus  Primary osteoarthritis of both hands: She has PIP and DIP thickening consistent with osteoarthritis of both hands.  No tenderness or inflammation was noted.  She was able to make a complete fist bilaterally.  Discussed the importance of joint protection and muscle strengthening.  Primary osteoarthritis of both knees: She is not experiencing any discomfort in her left knee joint at this time.  She has good range of motion of the left knee with no warmth or effusion.  She presented today with increased discomfort in the right knee which started about 2 to 3 months ago.  She did not have any injury prior to the onset of symptoms.  X-rays of the right knee were updated today.  She declined a right knee joint cortisone injection.  We discussed conservative treatment options.  She was advised to notify us if her discomfort persists or worsens.  Acute pain of right knee -She presents today with increased pain in the right knee joint which started 2 to 3 months ago.  She did not have any injury or fall prior to the onset of symptoms.  She is not experiencing mechanical symptoms at this time.  She has been having difficulty rising from a seated position due to the pain and stiffness.  She has  been taking Aleve as needed for pain relief.  On examination she has warmth and swelling of the right knee joint.  X-rays of the right knee were obtained today.  She declined a cortisone  injection at this time.  We discussed the importance of rest, ice, compression, and elevation.  CMP with GFR was ordered today due to her history of elevated LFTs she was advised to hold off on taking Tylenol or NSAIDs until her lab work has resulted.  She was advised to notify us if her discomfort persists or worsens so she can return for a cortisone injection.  She will try increasing the frequency of Humira dosing to every 18 days pending CBC with differential today.  We will also check a sed rate today.  Plan: Sedimentation rate, XR KNEE 3 VIEW RIGHT  Primary osteoarthritis of both feet - Bilateral hammertoes.  She is not experiencing any discomfort in her feet at this time.  She has good range of motion of both ankle joints with no tenderness or inflammation.  No evidence of Achilles tinnitus or plantar fasciitis.  She is wearing proper fitting shoes.  DDD (degenerative disc disease), lumbar: She is not experiencing any discomfort in her lower back at this time.  She has no symptoms of radiculopathy.  History of hypertension: Blood pressure was 118/77 today in the office.  Vitamin D deficiency: She is taking vitamin D supplement on a daily basis.  Other fatigue: Stable  Osteoporosis screening - DEXA ordered by PCP and was normal per patient.       Orders: Orders Placed This Encounter  Procedures   XR KNEE 3 VIEW RIGHT   QuantiFERON-TB Gold Plus   CBC with Differential/Platelet   COMPLETE METABOLIC PANEL WITH GFR   Sedimentation rate   No orders of the defined types were placed in this encounter.    Follow-Up Instructions: Return in about 3 months (around 09/01/2021) for Psoriatic arthritis, DDD.   Gearldine Bienenstockaylor M Arbutus Nelligan, PA-C  Note - This record has been created using Dragon software.  Chart creation  errors have been sought, but may not always  have been located. Such creation errors do not reflect on  the standard of medical care.

## 2021-05-20 ENCOUNTER — Other Ambulatory Visit (HOSPITAL_COMMUNITY): Payer: Self-pay

## 2021-06-01 ENCOUNTER — Other Ambulatory Visit: Payer: Self-pay

## 2021-06-01 ENCOUNTER — Ambulatory Visit: Payer: BC Managed Care – PPO | Admitting: Physician Assistant

## 2021-06-01 ENCOUNTER — Encounter: Payer: Self-pay | Admitting: Physician Assistant

## 2021-06-01 ENCOUNTER — Ambulatory Visit: Payer: Self-pay

## 2021-06-01 VITALS — BP 118/77 | HR 72 | Ht 65.0 in | Wt 222.0 lb

## 2021-06-01 DIAGNOSIS — M19041 Primary osteoarthritis, right hand: Secondary | ICD-10-CM

## 2021-06-01 DIAGNOSIS — R5383 Other fatigue: Secondary | ICD-10-CM

## 2021-06-01 DIAGNOSIS — M19042 Primary osteoarthritis, left hand: Secondary | ICD-10-CM

## 2021-06-01 DIAGNOSIS — L4050 Arthropathic psoriasis, unspecified: Secondary | ICD-10-CM

## 2021-06-01 DIAGNOSIS — M25561 Pain in right knee: Secondary | ICD-10-CM

## 2021-06-01 DIAGNOSIS — Z79899 Other long term (current) drug therapy: Secondary | ICD-10-CM | POA: Diagnosis not present

## 2021-06-01 DIAGNOSIS — M19072 Primary osteoarthritis, left ankle and foot: Secondary | ICD-10-CM

## 2021-06-01 DIAGNOSIS — Z13820 Encounter for screening for osteoporosis: Secondary | ICD-10-CM

## 2021-06-01 DIAGNOSIS — L409 Psoriasis, unspecified: Secondary | ICD-10-CM | POA: Diagnosis not present

## 2021-06-01 DIAGNOSIS — M170 Bilateral primary osteoarthritis of knee: Secondary | ICD-10-CM

## 2021-06-01 DIAGNOSIS — E559 Vitamin D deficiency, unspecified: Secondary | ICD-10-CM

## 2021-06-01 DIAGNOSIS — Z111 Encounter for screening for respiratory tuberculosis: Secondary | ICD-10-CM

## 2021-06-01 DIAGNOSIS — M19071 Primary osteoarthritis, right ankle and foot: Secondary | ICD-10-CM

## 2021-06-01 DIAGNOSIS — M5136 Other intervertebral disc degeneration, lumbar region: Secondary | ICD-10-CM

## 2021-06-01 DIAGNOSIS — Z8679 Personal history of other diseases of the circulatory system: Secondary | ICD-10-CM

## 2021-06-01 NOTE — Progress Notes (Signed)
Please call the patient to review x-rays: She has moderate OA of the right knee.

## 2021-06-02 NOTE — Progress Notes (Signed)
RBC count and hct are borderline elevated.  Rest of CBC WNL.  CMP WNL.  ESR WNL.

## 2021-06-05 LAB — COMPLETE METABOLIC PANEL WITH GFR
AG Ratio: 1.8 (calc) (ref 1.0–2.5)
ALT: 17 U/L (ref 6–29)
AST: 15 U/L (ref 10–35)
Albumin: 4.2 g/dL (ref 3.6–5.1)
Alkaline phosphatase (APISO): 72 U/L (ref 37–153)
BUN: 14 mg/dL (ref 7–25)
CO2: 29 mmol/L (ref 20–32)
Calcium: 9.1 mg/dL (ref 8.6–10.4)
Chloride: 107 mmol/L (ref 98–110)
Creat: 0.62 mg/dL (ref 0.50–1.05)
Globulin: 2.3 g/dL (calc) (ref 1.9–3.7)
Glucose, Bld: 88 mg/dL (ref 65–99)
Potassium: 5.2 mmol/L (ref 3.5–5.3)
Sodium: 140 mmol/L (ref 135–146)
Total Bilirubin: 0.8 mg/dL (ref 0.2–1.2)
Total Protein: 6.5 g/dL (ref 6.1–8.1)
eGFR: 99 mL/min/{1.73_m2} (ref >=60)

## 2021-06-05 LAB — CBC WITH DIFFERENTIAL/PLATELET
Absolute Monocytes: 428 cells/uL (ref 200–950)
Basophils Absolute: 80 cells/uL (ref 0–200)
Basophils Relative: 1.9 %
Eosinophils Absolute: 59 cells/uL (ref 15–500)
Eosinophils Relative: 1.4 %
HCT: 46.5 % — ABNORMAL HIGH (ref 35.0–45.0)
Hemoglobin: 15 g/dL (ref 11.7–15.5)
Lymphs Abs: 1747 cells/uL (ref 850–3900)
MCH: 28.9 pg (ref 27.0–33.0)
MCHC: 32.3 g/dL (ref 32.0–36.0)
MCV: 89.6 fL (ref 80.0–100.0)
MPV: 10.7 fL (ref 7.5–12.5)
Monocytes Relative: 10.2 %
Neutro Abs: 1886 cells/uL (ref 1500–7800)
Neutrophils Relative %: 44.9 %
Platelets: 225 10*3/uL (ref 140–400)
RBC: 5.19 10*6/uL — ABNORMAL HIGH (ref 3.80–5.10)
RDW: 13.8 % (ref 11.0–15.0)
Total Lymphocyte: 41.6 %
WBC: 4.2 10*3/uL (ref 3.8–10.8)

## 2021-06-05 LAB — QUANTIFERON-TB GOLD PLUS
Mitogen-NIL: >10 IU/mL
NIL: 0.02 IU/mL
QuantiFERON-TB Gold Plus: NEGATIVE
TB1-NIL: 0 IU/mL
TB2-NIL: 0 IU/mL

## 2021-06-05 LAB — SEDIMENTATION RATE: Sed Rate: 2 mm/h (ref 0–30)

## 2021-06-07 NOTE — Progress Notes (Signed)
TB gold negative

## 2021-06-15 ENCOUNTER — Other Ambulatory Visit (HOSPITAL_COMMUNITY): Payer: Self-pay

## 2021-06-17 ENCOUNTER — Other Ambulatory Visit (HOSPITAL_COMMUNITY): Payer: Self-pay

## 2021-07-08 ENCOUNTER — Other Ambulatory Visit (HOSPITAL_COMMUNITY): Payer: Self-pay

## 2021-07-12 ENCOUNTER — Other Ambulatory Visit (HOSPITAL_COMMUNITY): Payer: Self-pay

## 2021-07-26 ENCOUNTER — Other Ambulatory Visit (HOSPITAL_COMMUNITY): Payer: Self-pay

## 2021-08-03 NOTE — Progress Notes (Signed)
Office Visit Note  Patient: Karen Bryant             Date of Birth: 09/14/1956           MRN: [id number]             PCP: Monico Blitz, MD Referring: Monico Blitz, MD Visit Date: 08/17/2021 Occupation: @GUAROCC @  Subjective:  Medication monitoring  History of Present Illness: Karen Bryant is a 65 y.o. female with history of psoriatic arthritis, osteoarthritis, and DDD.  Patient is on Humira 40 mg sq injections every 21 days.  She denies any signs or symptoms of a psoriatic arthritis flare.  She has noticed improvement in her joint pain and stiffness since increasing the frequency of Humira from every 28 days to every 21 days.  She states that her right knee joint pain has resolved.  She is not experiencing any SI joint discomfort.  She denies any symptoms of Achilles tendinitis or plantar fasciitis.  She denies any active psoriasis at this time. She denies any recent infections.  She is unsure if she received the flu shot but plans on checking with her PCP for clarification.     Activities of Daily Living:  Patient reports morning stiffness for 10-15 minutes.   Patient Denies nocturnal pain.  Difficulty dressing/grooming: Denies Difficulty climbing stairs: Denies Difficulty getting out of chair: Denies Difficulty using hands for taps, buttons, cutlery, and/or writing: Denies  Review of Systems  Constitutional:  Negative for fatigue.  HENT:  Negative for mouth sores, mouth dryness and nose dryness.   Eyes:  Negative for pain, itching and dryness.  Respiratory:  Negative for shortness of breath and difficulty breathing.   Cardiovascular:  Negative for chest pain and palpitations.  Gastrointestinal:  Negative for blood in stool, constipation and diarrhea.  Endocrine: Negative for increased urination.  Genitourinary:  Negative for difficulty urinating.  Musculoskeletal:  Positive for morning stiffness. Negative for joint pain, joint pain, joint swelling, myalgias, muscle  tenderness and myalgias.  Skin:  Negative for color change, rash and redness.  Allergic/Immunologic: Negative for susceptible to infections.  Neurological:  Negative for dizziness, numbness, headaches, memory loss and weakness.  Hematological:  Negative for bruising/bleeding tendency.  Psychiatric/Behavioral:  Negative for confusion.    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   Essential hypertension 01/20/2017   History of depression 01/20/2017   Psoriatic arthropathy ([place]) 01/16/2017   Psoriasis 01/16/2017   Primary osteoarthritis of both feet 01/16/2017   Primary osteoarthritis of both hands 01/16/2017   Primary osteoarthritis of both knees 01/16/2017    Past Medical History:  Diagnosis Date   Psoriatic arthritis ([place])     Family History  Problem Relation Age of Onset   Alzheimer's disease Mother    Heart failure Mother    Parkinson's disease Father    Cancer Father        prostate   Cancer Sister        breast cancer    Healthy Daughter    Healthy Daughter    Past Surgical History:  Procedure Laterality Date   CHOLECYSTECTOMY  03/10/2021   TUBAL LIGATION     Social History   Social History Narrative   Not on file   Immunization History  Administered Date(s) Administered   Moderna Sars-Covid-2 Vaccination 01/09/2020, 02/08/2020     Objective: Vital Signs: BP 139/84 (BP Location: Left Arm,  Patient Position: Sitting, Cuff Size: Large)   Pulse 65   Ht 5' 5"  (1.651 m)   Wt 227 lb 6.4 oz (103.1 kg)   BMI 37.84 kg/m    Physical Exam Vitals and nursing note reviewed.  Constitutional:      Appearance: She is well-developed.  HENT:     Head: Normocephalic and atraumatic.  Eyes:     Conjunctiva/sclera: Conjunctivae normal.  Pulmonary:     Effort: Pulmonary effort is normal.  Abdominal:     Palpations: Abdomen is soft.  Musculoskeletal:     Cervical back:  Normal range of motion.  Skin:    General: Skin is warm and dry.     Capillary Refill: Capillary refill takes less than 2 seconds.  Neurological:     Mental Status: She is alert and oriented to person, place, and time.  Psychiatric:        Behavior: Behavior normal.     Musculoskeletal Exam: C-spine, thoracic spine, lumbar spine have good range of motion with no discomfort.  Shoulder joints, elbow joints, wrist joints, MCPs, PIPs, DIPs have good range of motion with no synovitis.  PIP and DIP thickening consistent with osteoarthritis of both hands.  Complete fist formation bilaterally.  Hip joints have good range of motion with no groin pain.  Knee joints have good range of motion with no warmth or effusion.  Ankle joints have good range of motion with no tenderness or joint swelling.  No evidence of Achilles tendinitis or plantar fasciitis.  No tenderness over MTP joints.  Bunions noted bilaterally.  Hammertoes of the left second and third toes noted.  CDAI Exam: CDAI Score: -- Patient Global: --; Provider Global: -- Swollen: 0 ; Tender: 0  Joint Exam 08/17/2021   No joint exam has been documented for this visit   There is currently no information documented on the homunculus. Go to the Rheumatology activity and complete the homunculus joint exam.  Investigation: No additional findings.  Imaging: No results found.  Recent Labs: Lab Results  Component Value Date   WBC 4.2 06/01/2021   HGB 15.0 06/01/2021   PLT 225 06/01/2021   NA 140 06/01/2021   K 5.2 06/01/2021   CL 107 06/01/2021   CO2 29 06/01/2021   GLUCOSE 88 06/01/2021   BUN 14 06/01/2021   CREATININE 0.62 06/01/2021   BILITOT 0.8 06/01/2021   ALKPHOS 62 06/13/2017   AST 15 06/01/2021   ALT 17 06/01/2021   PROT 6.5 06/01/2021   ALBUMIN 4.1 06/13/2017   CALCIUM 9.1 06/01/2021   GFRAA 109 11/17/2020   QFTBGOLDPLUS NEGATIVE 06/01/2021    Speciality Comments: No specialty comments available.  Procedures:  No  procedures performed Allergies: Patient has no known allergies.   Assessment / Plan:     Visit Diagnoses: Psoriatic arthropathy ([place]): She has no synovitis or dactylitis on examination today.  She has not had any signs or symptoms of a psoriatic arthritis flare recently.  She is clinically doing well on Humira 40 mg subcutaneous injections every 21 days.  After her last office visit on 06/01/2021 she was advised to increase the frequency of humira from every 28 days to every 21 days due to experiencing increased pain and inflammation in the right knee.  X-rays of the right knee were updated at her last visit which were consistent with moderate OA.  She declined a cortisone injections at that time.  Her right knee joint pain has resolved.  She is not experiencing any  SI joint pain, stiffness, or nocturnal symptoms.  She has no evidence of Achilles tendinitis or plantar fasciitis.  She has no active psoriasis at this time.  No eye pain or inflammation.  She will remain on Humira 40 mg subcutaneous injections every 21 days.  She was advised to notify us if she develops an increased frequency of infections.  We will continue to monitor lab work closely.  CBC and CMP will be drawn today.  She was advised to notify us if she develops signs or symptoms of a flare.  She will follow-up in the office in 3 months.  Psoriasis: She has no active psoriasis at this time.  No fingernail or toenail pitting needed.  High risk medication use - Humira 40 mg sq injections once every 21 days. Spacing humira due to low WBC count in November 2021. CBC and CMP updated on 06/01/21.  She is due to update lab work today.  Orders released.  Her next lab work will be due in February and every 3 months to monitor for drug toxicity.  TB gold negative on 06/01/21.  ESR WNL on 06/01/21.   - Plan: CBC with Differential/Platelet, COMPLETE METABOLIC PANEL WITH GFR She has not had any recent infections.  Discussed the importance of holding Humira  if she develops signs or symptoms of an infection and to resume once the infection has completely cleared.  She voiced understanding. Discussed the importance of yearly skin examinations while on Humira due to the increased risk for nonmelanoma skin cancer.  Primary osteoarthritis of both hands: She has PIP and DIP thickening consistent with osteoarthritis of both hands.  No tenderness or inflammation was noted on examination today.  Complete fist formation bilaterally.  Discussed the importance of joint protection and muscle strengthening.  Primary osteoarthritis of both knees: She has good range of motion of both knee joints with no discomfort.  No warmth or effusion was noted.  X-rays of the right knee were updated on 06/01/2021 which were consistent with moderate osteoarthritis.  Her right knee joint pain has resolved.    Primary osteoarthritis of both feet - Bilateral hammertoes and bunions noted.  No evidence of achilles tendonitis or plantar fasciitis.  Good ROM of both ankle joints with no tenderness or inflammation.  Pitting edema noted in both LE. Discussed the importance of wearing proper fitting shoes.    DDD (degenerative disc disease), lumbar: She is not experiencing any discomfort in her lower back. No symptoms of radiculopathy.   Other fatigue: Stable. Discussed the importance of regular exercise.   Vitamin D deficiency: She is taking a calcium and vitamin D supplement daily.   Other medical conditions are listed as follows:  History of hypertension: BP was 139/84 today in the office.   Osteoporosis screening - DEXA ordered by PCP and was normal per patient.   Orders: Orders Placed This Encounter  Procedures   CBC with Differential/Platelet   COMPLETE METABOLIC PANEL WITH GFR   No orders of the defined types were placed in this encounter.    Follow-Up Instructions: Return in about 3 months (around 11/17/2021) for Psoriatic arthritis, Osteoarthritis, DDD.   Ofilia Neas, PA-C  Note - This record has been created using Dragon software.  Chart creation errors have been sought, but may not always  have been located. Such creation errors do not reflect on  the standard of medical care.

## 2021-08-16 ENCOUNTER — Other Ambulatory Visit (HOSPITAL_COMMUNITY): Payer: Self-pay

## 2021-08-17 ENCOUNTER — Other Ambulatory Visit: Payer: Self-pay

## 2021-08-17 ENCOUNTER — Encounter: Payer: Self-pay | Admitting: Physician Assistant

## 2021-08-17 ENCOUNTER — Ambulatory Visit: Payer: BC Managed Care – PPO | Admitting: Physician Assistant

## 2021-08-17 VITALS — BP 139/84 | HR 65 | Ht 65.0 in | Wt 227.4 lb

## 2021-08-17 DIAGNOSIS — M5136 Other intervertebral disc degeneration, lumbar region: Secondary | ICD-10-CM

## 2021-08-17 DIAGNOSIS — M19041 Primary osteoarthritis, right hand: Secondary | ICD-10-CM

## 2021-08-17 DIAGNOSIS — R5383 Other fatigue: Secondary | ICD-10-CM

## 2021-08-17 DIAGNOSIS — L409 Psoriasis, unspecified: Secondary | ICD-10-CM

## 2021-08-17 DIAGNOSIS — L4050 Arthropathic psoriasis, unspecified: Secondary | ICD-10-CM | POA: Diagnosis not present

## 2021-08-17 DIAGNOSIS — M19042 Primary osteoarthritis, left hand: Secondary | ICD-10-CM

## 2021-08-17 DIAGNOSIS — Z8679 Personal history of other diseases of the circulatory system: Secondary | ICD-10-CM

## 2021-08-17 DIAGNOSIS — Z79899 Other long term (current) drug therapy: Secondary | ICD-10-CM | POA: Diagnosis not present

## 2021-08-17 DIAGNOSIS — M19072 Primary osteoarthritis, left ankle and foot: Secondary | ICD-10-CM

## 2021-08-17 DIAGNOSIS — E559 Vitamin D deficiency, unspecified: Secondary | ICD-10-CM

## 2021-08-17 DIAGNOSIS — Z13820 Encounter for screening for osteoporosis: Secondary | ICD-10-CM

## 2021-08-17 DIAGNOSIS — M170 Bilateral primary osteoarthritis of knee: Secondary | ICD-10-CM

## 2021-08-17 DIAGNOSIS — M19071 Primary osteoarthritis, right ankle and foot: Secondary | ICD-10-CM

## 2021-08-17 LAB — CBC WITH DIFFERENTIAL/PLATELET
Absolute Monocytes: 418 cells/uL (ref 200–950)
Basophils Absolute: 52 cells/uL (ref 0–200)
Basophils Relative: 1.1 %
Eosinophils Absolute: 52 cells/uL (ref 15–500)
Eosinophils Relative: 1.1 %
HCT: 46.1 % — ABNORMAL HIGH (ref 35.0–45.0)
Hemoglobin: 15.2 g/dL (ref 11.7–15.5)
Lymphs Abs: 1951 cells/uL (ref 850–3900)
MCH: 29.1 pg (ref 27.0–33.0)
MCHC: 33 g/dL (ref 32.0–36.0)
MCV: 88.3 fL (ref 80.0–100.0)
MPV: 11.2 fL (ref 7.5–12.5)
Monocytes Relative: 8.9 %
Neutro Abs: 2228 cells/uL (ref 1500–7800)
Neutrophils Relative %: 47.4 %
Platelets: 220 10*3/uL (ref 140–400)
RBC: 5.22 10*6/uL — ABNORMAL HIGH (ref 3.80–5.10)
RDW: 13.5 % (ref 11.0–15.0)
Total Lymphocyte: 41.5 %
WBC: 4.7 10*3/uL (ref 3.8–10.8)

## 2021-08-17 LAB — COMPLETE METABOLIC PANEL WITH GFR
AG Ratio: 1.7 (calc) (ref 1.0–2.5)
ALT: 19 U/L (ref 6–29)
AST: 18 U/L (ref 10–35)
Albumin: 4.2 g/dL (ref 3.6–5.1)
Alkaline phosphatase (APISO): 78 U/L (ref 37–153)
BUN: 14 mg/dL (ref 7–25)
CO2: 27 mmol/L (ref 20–32)
Calcium: 9.1 mg/dL (ref 8.6–10.4)
Chloride: 107 mmol/L (ref 98–110)
Creat: 0.63 mg/dL (ref 0.50–1.05)
Globulin: 2.5 g/dL (calc) (ref 1.9–3.7)
Glucose, Bld: 102 mg/dL — ABNORMAL HIGH (ref 65–99)
Potassium: 4.8 mmol/L (ref 3.5–5.3)
Sodium: 141 mmol/L (ref 135–146)
Total Bilirubin: 1.1 mg/dL (ref 0.2–1.2)
Total Protein: 6.7 g/dL (ref 6.1–8.1)
eGFR: 99 mL/min/{1.73_m2} (ref >=60)

## 2021-08-17 NOTE — Patient Instructions (Signed)
Standing Labs We placed an order today for your standing lab work.   Please have your standing labs drawn in February and every 3 months   If possible, please have your labs drawn 2 weeks prior to your appointment so that the provider can discuss your results at your appointment.  Please note that you may see your imaging and lab results in MyChart before we have reviewed them. We may be awaiting multiple results to interpret others before contacting you. Please allow our office up to 72 hours to thoroughly review all of the results before contacting the office for clarification of your results.  We have open lab daily: Monday through Thursday from 1:30-4:30 PM and Friday from 1:30-4:00 PM at the office of Dr. Shaili Deveshwar, [facility].   Please be advised, all patients with office appointments requiring lab work will take precedent over walk-in lab work.  If possible, please come for your lab work on Monday and Friday afternoons, as you may experience shorter wait times. The office is located at [address], [address], [place], [place] [address] No appointment is necessary.   Labs are drawn by [facility]. Please bring your co-pay at the time of your lab draw.  You may receive a bill from [facility] for your lab work.  If you wish to have your labs drawn at another location, please call the office 24 hours in advance to send orders.  If you have any questions regarding directions or hours of operation,  please call [phone].   As a reminder, please drink plenty of water prior to coming for your lab work. Thanks!  

## 2021-08-18 NOTE — Progress Notes (Signed)
CMP WNL.  RBC count and hematocrit slightly elevated but stable. Rest of CBC WNL.

## 2021-10-07 ENCOUNTER — Other Ambulatory Visit (HOSPITAL_COMMUNITY): Payer: Self-pay

## 2021-10-25 ENCOUNTER — Other Ambulatory Visit (HOSPITAL_COMMUNITY): Payer: Self-pay

## 2021-10-26 NOTE — Progress Notes (Signed)
Office Visit Note  Patient: Karen Bryant             Date of Birth: [birth year]           MRN: [id number]             PCP: Monico Blitz, MD Referring: Monico Blitz, MD Visit Date: 11/09/2021 Occupation: @GUAROCC @  Subjective:  Other of the Right Knee (Feel stiff) and Other of the Left Knee (Feel stiff )   History of Present Illness: Karen Bryant is a 66 y.o. female with a history of psoriatic arthritis and psoriasis.  She states she continues to have pain and discomfort in her knee joint since August 2022.  She states she was seen by an orthopedic surgeon last week and had repeat x-rays of her knee joints.  She was given a cortisone injection in her right knee which gave her some relief.  None of the other joints are painful.  She has not had any psoriasis rashes.  She has been taking Humira injections every 21 days.  She would prefer to go back to every other week as she has been experiencing increased discomfort in her knees with the space dosing.  She denies any discomfort in her hands, feet or lower back currently.  Activities of Daily Living:  Patient reports morning stiffness for 15 minutes.   Patient Reports nocturnal pain.  Difficulty dressing/grooming: Denies Difficulty climbing stairs: Reports Difficulty getting out of chair: Reports Difficulty using hands for taps, buttons, cutlery, and/or writing: Denies  Review of Systems  Constitutional:  Positive for fatigue. Negative for night sweats, weight gain and weight loss.  HENT: Negative.  Negative for mouth sores, trouble swallowing, trouble swallowing, mouth dryness and nose dryness.   Eyes: Negative.  Negative for pain, redness, visual disturbance and dryness.  Respiratory: Negative.  Negative for cough, shortness of breath and difficulty breathing.   Cardiovascular: Negative.  Negative for chest pain, palpitations, hypertension, irregular heartbeat and swelling in legs/feet.  Gastrointestinal:  Positive for heartburn.  Negative for blood in stool, constipation and diarrhea.  Endocrine: Negative.  Negative for increased urination.  Genitourinary: Negative.  Negative for vaginal dryness.  Musculoskeletal:  Positive for joint pain, joint pain and joint swelling. Negative for myalgias, muscle weakness, morning stiffness, muscle tenderness and myalgias.       Knees are stiff and they swell  Skin: Negative.  Negative for color change, rash, hair loss, skin tightness, ulcers and sensitivity to sunlight.  Allergic/Immunologic: Negative for susceptible to infections.  Neurological: Negative.  Negative for dizziness, memory loss, night sweats and weakness.  Hematological:  Positive for bruising/bleeding tendency. Negative for swollen glands.  Psychiatric/Behavioral: Negative.  Negative for depressed mood and sleep disturbance. The patient is not nervous/anxious.    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   Essential hypertension 01/20/2017   History of depression 01/20/2017   Psoriatic arthropathy ([place]) 01/16/2017   Psoriasis 01/16/2017   Primary osteoarthritis of both feet 01/16/2017   Primary osteoarthritis of both hands 01/16/2017   Primary osteoarthritis of both knees 01/16/2017    Past Medical History:  Diagnosis Date   Psoriatic arthritis ([place])     Family History  Problem Relation Age of Onset   Alzheimer's disease Mother    Heart failure Mother    Parkinson's disease Father    Cancer Father        prostate  Cancer Sister        breast cancer    Healthy Daughter    Healthy Daughter    Past Surgical History:  Procedure Laterality Date   CHOLECYSTECTOMY  03/10/2021   TUBAL LIGATION     Social History   Social History Narrative   Not on file   Immunization History  Administered Date(s) Administered   Moderna Sars-Covid-2 Vaccination 01/09/2020, 02/08/2020     Objective: Vital Signs: BP (!)  148/84    Pulse 81    Ht 5\' 5"  (1.651 m)    Wt 224 lb (101.6 kg)    BMI 37.28 kg/m    Physical Exam Vitals and nursing note reviewed.  Constitutional:      Appearance: She is well-developed.  HENT:     Head: Normocephalic and atraumatic.  Eyes:     Conjunctiva/sclera: Conjunctivae normal.  Cardiovascular:     Rate and Rhythm: Normal rate and regular rhythm.     Heart sounds: Normal heart sounds.  Pulmonary:     Effort: Pulmonary effort is normal.     Breath sounds: Normal breath sounds.  Abdominal:     General: Bowel sounds are normal.     Palpations: Abdomen is soft.  Musculoskeletal:     Cervical back: Normal range of motion.  Lymphadenopathy:     Cervical: No cervical adenopathy.  Skin:    General: Skin is warm and dry.     Capillary Refill: Capillary refill takes less than 2 seconds.  Neurological:     Mental Status: She is alert and oriented to person, place, and time.  Psychiatric:        Behavior: Behavior normal.     Musculoskeletal Exam: C-spine was in good range of motion.  Shoulder joints, elbow joints, wrist joints, MCPs PIPs and DIPs with good range of motion.  She had PIP and DIP thickening with no synovitis.  Hip joints and knee joints in good range of motion.  She had discomfort range of motion of the knee joints without any warmth swelling or effusion.  There was no tenderness over ankles or MTPs.  There was no evidence of Achilles tendinitis or plantar fasciitis.  CDAI Exam: CDAI Score: -- Patient Global: --; Provider Global: -- Swollen: --; Tender: -- Joint Exam 11/09/2021   No joint exam has been documented for this visit   There is currently no information documented on the homunculus. Go to the Rheumatology activity and complete the homunculus joint exam.  Investigation: No additional findings.  Imaging: No results found.  Recent Labs: Lab Results  Component Value Date   WBC 4.7 08/17/2021   HGB 15.2 08/17/2021   PLT 220 08/17/2021   NA  141 08/17/2021   K 4.8 08/17/2021   CL 107 08/17/2021   CO2 27 08/17/2021   GLUCOSE 102 (H) 08/17/2021   BUN 14 08/17/2021   CREATININE 0.63 08/17/2021   BILITOT 1.1 08/17/2021   ALKPHOS 62 06/13/2017   AST 18 08/17/2021   ALT 19 08/17/2021   PROT 6.7 08/17/2021   ALBUMIN 4.1 06/13/2017   CALCIUM 9.1 08/17/2021   GFRAA 109 11/17/2020   QFTBGOLDPLUS NEGATIVE 06/01/2021    Speciality Comments: No specialty comments available.  Procedures:  No procedures performed Allergies: Patient has no known allergies.   Assessment / Plan:     Visit Diagnoses: Psoriatic arthropathy (HCC)-she had no synovitis on my examination.  Although she has noticed increased pain and discomfort in her knee joints and she has been  on Humira 40 mg subcu every 3 weeks.  She would prefer to go back to every 14-day dosing.  No synovitis was noted.  We will send a new prescription.  Psoriasis-she had no active lesions.  High risk medication use - Humira 40 mg sq injections once every 21 days. Spacing humira due to low WBC count in November 2022.  Labs obtained on August 17, 2021 were within normal limits.  TB gold was negative in June 01, 2021.  She will get labs today and then every 3 months to monitor for drug toxicity.  Recommendations regarding immunization was placed in the AVS.  She was advised to hold off Humira in case she develops an infection and resume after the infection resolves.  Annual skin examination to screen for skin cancer was also advised.  Primary osteoarthritis of both hands-she had bilateral PIP and DIP thickening with no synovitis.  Primary osteoarthritis of both knees - X-rays of the right knee were updated on 06/01/2021 which were consistent with moderate osteoarthritis.  Patient states she was having increased pain and discomfort in the bilateral knee joints.  She was seen by Dr. Lyla Glassing last Tuesday and had cortisone injection to her right knee joint.  She had no warmth swelling or  effusion today.  Primary osteoarthritis of both feet - Bilateral hammertoes and bunions noted.   DDD (degenerative disc disease), lumbar-she denies any discomfort today.  Other fatigue  Vitamin D deficiency-she is on vitamin D supplement.  History of hypertension-blood pressure was elevated today.  She has been advised to monitor blood pressure closely.  Osteoporosis screening - DEXA ordered by PCP and was normal per patient.   Orders: Orders Placed This Encounter  Procedures   CBC with Differential/Platelet   COMPLETE METABOLIC PANEL WITH GFR   Meds ordered this encounter  Medications   Adalimumab (HUMIRA PEN) 40 MG/0.4ML PNKT    Sig: Inject 40 mg into the skin every 14 (fourteen) days.    Dispense:  2 each    Refill:  0    **NOTE dose change - next dose is now due 11/17/21**     Follow-Up Instructions: Return in about 5 months (around 04/08/2022) for Psoriatic arthritis.   Bo Merino, MD  Note - This record has been created using Editor, commissioning.  Chart creation errors have been sought, but may not always  have been located. Such creation errors do not reflect on  the standard of medical care.

## 2021-11-08 ENCOUNTER — Other Ambulatory Visit (HOSPITAL_COMMUNITY): Payer: Self-pay

## 2021-11-08 ENCOUNTER — Other Ambulatory Visit: Payer: Self-pay | Admitting: Physician Assistant

## 2021-11-08 DIAGNOSIS — L4050 Arthropathic psoriasis, unspecified: Secondary | ICD-10-CM

## 2021-11-08 MED ORDER — HUMIRA (2 PEN) 40 MG/0.4ML ~~LOC~~ AJKT
AUTO-INJECTOR | SUBCUTANEOUS | 1 refills | Status: DC
  Filled 2021-11-08: qty 1, 21d supply, fill #0

## 2021-11-08 NOTE — Telephone Encounter (Signed)
Next Visit: 11/09/2021  Last Visit: 08/17/2021  Last Fill: 05/17/2021  [id number] arthropathy   Current Dose per office note 08/17/2021: Humira 40 mg sq injections once every 21 days  Labs: 08/17/2021 CMP WNL.  RBC count and hematocrit slightly elevated but stable. Rest of CBC WNL.    TB Gold: 06/01/2021 Neg    Okay to refill Humira?

## 2021-11-09 ENCOUNTER — Ambulatory Visit: Payer: BC Managed Care – PPO | Admitting: Rheumatology

## 2021-11-09 ENCOUNTER — Other Ambulatory Visit: Payer: Self-pay

## 2021-11-09 ENCOUNTER — Encounter: Payer: Self-pay | Admitting: Rheumatology

## 2021-11-09 ENCOUNTER — Other Ambulatory Visit (HOSPITAL_COMMUNITY): Payer: Self-pay

## 2021-11-09 VITALS — BP 148/84 | HR 81 | Ht 65.0 in | Wt 224.0 lb

## 2021-11-09 DIAGNOSIS — Z13820 Encounter for screening for osteoporosis: Secondary | ICD-10-CM

## 2021-11-09 DIAGNOSIS — Z8679 Personal history of other diseases of the circulatory system: Secondary | ICD-10-CM

## 2021-11-09 DIAGNOSIS — M19072 Primary osteoarthritis, left ankle and foot: Secondary | ICD-10-CM

## 2021-11-09 DIAGNOSIS — M19071 Primary osteoarthritis, right ankle and foot: Secondary | ICD-10-CM

## 2021-11-09 DIAGNOSIS — L409 Psoriasis, unspecified: Secondary | ICD-10-CM

## 2021-11-09 DIAGNOSIS — L4050 Arthropathic psoriasis, unspecified: Secondary | ICD-10-CM | POA: Diagnosis not present

## 2021-11-09 DIAGNOSIS — M5136 Other intervertebral disc degeneration, lumbar region: Secondary | ICD-10-CM

## 2021-11-09 DIAGNOSIS — M19041 Primary osteoarthritis, right hand: Secondary | ICD-10-CM

## 2021-11-09 DIAGNOSIS — M19042 Primary osteoarthritis, left hand: Secondary | ICD-10-CM

## 2021-11-09 DIAGNOSIS — Z79899 Other long term (current) drug therapy: Secondary | ICD-10-CM

## 2021-11-09 DIAGNOSIS — E559 Vitamin D deficiency, unspecified: Secondary | ICD-10-CM

## 2021-11-09 DIAGNOSIS — M170 Bilateral primary osteoarthritis of knee: Secondary | ICD-10-CM

## 2021-11-09 DIAGNOSIS — R5383 Other fatigue: Secondary | ICD-10-CM

## 2021-11-09 LAB — COMPLETE METABOLIC PANEL WITH GFR
AG Ratio: 1.8 (calc) (ref 1.0–2.5)
ALT: 16 U/L (ref 6–29)
AST: 14 U/L (ref 10–35)
Albumin: 4.3 g/dL (ref 3.6–5.1)
Alkaline phosphatase (APISO): 88 U/L (ref 37–153)
BUN: 13 mg/dL (ref 7–25)
CO2: 28 mmol/L (ref 20–32)
Calcium: 9.4 mg/dL (ref 8.6–10.4)
Chloride: 105 mmol/L (ref 98–110)
Creat: 0.72 mg/dL (ref 0.50–1.05)
Globulin: 2.4 g/dL (calc) (ref 1.9–3.7)
Glucose, Bld: 108 mg/dL — ABNORMAL HIGH (ref 65–99)
Potassium: 4.7 mmol/L (ref 3.5–5.3)
Sodium: 140 mmol/L (ref 135–146)
Total Bilirubin: 1.6 mg/dL — ABNORMAL HIGH (ref 0.2–1.2)
Total Protein: 6.7 g/dL (ref 6.1–8.1)
eGFR: 93 mL/min/{1.73_m2} (ref >=60)

## 2021-11-09 LAB — CBC WITH DIFFERENTIAL/PLATELET
Absolute Monocytes: 450 cells/uL (ref 200–950)
Basophils Absolute: 60 cells/uL (ref 0–200)
Basophils Relative: 1.2 %
Eosinophils Absolute: 50 cells/uL (ref 15–500)
Eosinophils Relative: 1 %
HCT: 46.5 % — ABNORMAL HIGH (ref 35.0–45.0)
Hemoglobin: 15.5 g/dL (ref 11.7–15.5)
Lymphs Abs: 2105 cells/uL (ref 850–3900)
MCH: 29.4 pg (ref 27.0–33.0)
MCHC: 33.3 g/dL (ref 32.0–36.0)
MCV: 88.2 fL (ref 80.0–100.0)
MPV: 10.5 fL (ref 7.5–12.5)
Monocytes Relative: 9 %
Neutro Abs: 2335 cells/uL (ref 1500–7800)
Neutrophils Relative %: 46.7 %
Platelets: 234 10*3/uL (ref 140–400)
RBC: 5.27 10*6/uL — ABNORMAL HIGH (ref 3.80–5.10)
RDW: 13.6 % (ref 11.0–15.0)
Total Lymphocyte: 42.1 %
WBC: 5 10*3/uL (ref 3.8–10.8)

## 2021-11-09 MED ORDER — HUMIRA (2 PEN) 40 MG/0.4ML ~~LOC~~ AJKT
40.0000 mg | AUTO-INJECTOR | SUBCUTANEOUS | 0 refills | Status: DC
  Filled 2021-11-09: qty 2, 28d supply, fill #0

## 2021-11-09 NOTE — Progress Notes (Signed)
Patient's Humira dose switching to 40mg  SQ every 14 days (previosuly 21 days). Rx re-sent to [facility] with note that her next dose is now due 11/17/21. Email sent to [facility] as well.  [facility], PharmD, MPH, BCPS Clinical Pharmacist (Rheumatology and Pulmonology)

## 2021-11-09 NOTE — Patient Instructions (Signed)
Standing Labs We placed an order today for your standing lab work.   Please have your standing labs drawn in April and every 3 months  If possible, please have your labs drawn 2 weeks prior to your appointment so that the provider can discuss your results at your appointment.  Please note that you may see your imaging and lab results in MyChart before we have reviewed them. We may be awaiting multiple results to interpret others before contacting you. Please allow our office up to 72 hours to thoroughly review all of the results before contacting the office for clarification of your results.  We have open lab daily: Monday through Thursday from 1:30-4:30 PM and Friday from 1:30-4:00 PM at the office of Dr. Pollyann Savoy, [facility].   Please be advised, all patients with office appointments requiring lab work will take precedent over walk-in lab work.  If possible, please come for your lab work on Monday and Friday afternoons, as you may experience shorter wait times. The office is located at [address], [address], [place], [place] [address] No appointment is necessary.   Labs are drawn by [facility]. Please bring your co-pay at the time of your lab draw.  You may receive a bill from [facility] for your lab work.  Please note if you are on Hydroxychloroquine and and an order has been placed for a Hydroxychloroquine level, you will need to have it drawn 4 hours or more after your last dose.  If you wish to have your labs drawn at another location, please call the office 24 hours in advance to send orders.  If you have any questions regarding directions or hours of operation,  please call [phone].   As a reminder, please drink plenty of water prior to coming for your lab work. Thanks!  Vaccines You are taking a medication(s) that can suppress your immune system.  The following immunizations are recommended: Flu annually Covid-19  Td/Tdap (tetanus, diphtheria, pertussis)  every 10 years Pneumonia (Prevnar 15 then Pneumovax 23 at least 1 year apart.  Alternatively, can take Prevnar 20 without needing additional dose) Shingrix: 2 doses from 4 weeks to 6 months apart  Please check with your PCP to make sure you are up to date.   If you have signs or symptoms of an infection or start antibiotics: First, call your PCP for workup of your infection. Hold your medication through the infection, until you complete your antibiotics, and until symptoms resolve if you take the following: Injectable medication (Actemra, Benlysta, Cimzia, Cosentyx, Enbrel, Humira, Kevzara, Orencia, Remicade, Simponi, Stelara, Taltz, Tremfya) Methotrexate Leflunomide (Arava) Mycophenolate (Cellcept) Osborne [place], or Rinvoq  Please get an annual skin examination by a dermatologist to screen for skin cancer while you are on Humira.

## 2021-11-10 ENCOUNTER — Other Ambulatory Visit (HOSPITAL_COMMUNITY): Payer: Self-pay

## 2021-11-10 NOTE — Progress Notes (Signed)
CBC and CMP are stable.

## 2021-11-11 ENCOUNTER — Other Ambulatory Visit (HOSPITAL_COMMUNITY): Payer: Self-pay

## 2021-11-29 ENCOUNTER — Other Ambulatory Visit (HOSPITAL_COMMUNITY): Payer: Self-pay

## 2021-11-30 ENCOUNTER — Other Ambulatory Visit (HOSPITAL_COMMUNITY): Payer: Self-pay

## 2021-11-30 ENCOUNTER — Other Ambulatory Visit: Payer: Self-pay | Admitting: Rheumatology

## 2021-11-30 DIAGNOSIS — L409 Psoriasis, unspecified: Secondary | ICD-10-CM

## 2021-11-30 DIAGNOSIS — L4050 Arthropathic psoriasis, unspecified: Secondary | ICD-10-CM

## 2021-11-30 DIAGNOSIS — Z79899 Other long term (current) drug therapy: Secondary | ICD-10-CM

## 2021-11-30 MED ORDER — HUMIRA (2 PEN) 40 MG/0.4ML ~~LOC~~ AJKT
40.0000 mg | AUTO-INJECTOR | SUBCUTANEOUS | 2 refills | Status: DC
  Filled 2021-11-30: qty 2, 28d supply, fill #0
  Filled 2021-12-28: qty 2, 28d supply, fill #1
  Filled 2022-01-24: qty 2, 28d supply, fill #2

## 2021-11-30 NOTE — Telephone Encounter (Signed)
Next Visit: 04/05/2022  Last Visit: 11/09/2021  Last Fill: 11/09/2021   DX: Psoriatic arthropathy   Current Dose per office note 11/09/2021: Humira 40 mg sq injections once every 21 days. Spacing humira due to low WBC count in November 2022. She would prefer to go back to every 14-day dosing  Labs: 11/09/2021 CBC and CMP are stable  TB Gold: 06/01/2021 Neg    Okay to refill Humira?

## 2021-12-06 ENCOUNTER — Other Ambulatory Visit (HOSPITAL_COMMUNITY): Payer: Self-pay

## 2021-12-28 ENCOUNTER — Other Ambulatory Visit (HOSPITAL_COMMUNITY): Payer: Self-pay

## 2021-12-30 ENCOUNTER — Other Ambulatory Visit (HOSPITAL_COMMUNITY): Payer: Self-pay

## 2022-01-24 ENCOUNTER — Other Ambulatory Visit (HOSPITAL_COMMUNITY): Payer: Self-pay

## 2022-01-27 ENCOUNTER — Other Ambulatory Visit (HOSPITAL_COMMUNITY): Payer: Self-pay

## 2022-02-17 ENCOUNTER — Other Ambulatory Visit (HOSPITAL_COMMUNITY): Payer: Self-pay

## 2022-02-17 ENCOUNTER — Other Ambulatory Visit: Payer: Self-pay | Admitting: Physician Assistant

## 2022-02-17 DIAGNOSIS — Z79899 Other long term (current) drug therapy: Secondary | ICD-10-CM

## 2022-02-17 DIAGNOSIS — L409 Psoriasis, unspecified: Secondary | ICD-10-CM

## 2022-02-17 DIAGNOSIS — L4050 Arthropathic psoriasis, unspecified: Secondary | ICD-10-CM

## 2022-02-17 MED ORDER — HUMIRA (2 PEN) 40 MG/0.4ML ~~LOC~~ AJKT
40.0000 mg | AUTO-INJECTOR | SUBCUTANEOUS | 2 refills | Status: DC
  Filled 2022-02-21: qty 2, 28d supply, fill #0
  Filled 2022-03-21: qty 2, 28d supply, fill #1
  Filled 2022-05-09: qty 2, 28d supply, fill #2

## 2022-02-17 NOTE — Telephone Encounter (Signed)
Next Visit: 04/05/2022 ? ?Last Visit: 11/09/2021 ? ?Last Fill: 11/30/2021 ? ?YC:[id number] arthropathy  ? ?Current Dose per office note 11/09/2021: Humira 40 mg sq injections once every 21 days. Spacing humira due to low WBC count in November 2022. She would prefer to go back to every 14-day dosing ? ?Labs: 11/09/2021 CBC and CMP are stable ? ?TB Gold:  06/01/2021 Neg    ? ?Patient advised she is due to update labs.  ? ?Okay to refill Humira?  ?

## 2022-02-21 ENCOUNTER — Other Ambulatory Visit (HOSPITAL_COMMUNITY): Payer: Self-pay

## 2022-02-21 ENCOUNTER — Other Ambulatory Visit: Payer: Self-pay | Admitting: *Deleted

## 2022-02-21 DIAGNOSIS — Z79899 Other long term (current) drug therapy: Secondary | ICD-10-CM

## 2022-02-22 LAB — COMPLETE METABOLIC PANEL WITH GFR
AG Ratio: 1.9 (calc) (ref 1.0–2.5)
ALT: 14 U/L (ref 6–29)
AST: 14 U/L (ref 10–35)
Albumin: 4.2 g/dL (ref 3.6–5.1)
Alkaline phosphatase (APISO): 64 U/L (ref 37–153)
BUN: 12 mg/dL (ref 7–25)
CO2: 26 mmol/L (ref 20–32)
Calcium: 9.2 mg/dL (ref 8.6–10.4)
Chloride: 104 mmol/L (ref 98–110)
Creat: 0.63 mg/dL (ref 0.50–1.05)
Globulin: 2.2 g/dL (calc) (ref 1.9–3.7)
Glucose, Bld: 87 mg/dL (ref 65–99)
Potassium: 4.4 mmol/L (ref 3.5–5.3)
Sodium: 139 mmol/L (ref 135–146)
Total Bilirubin: 1.3 mg/dL — ABNORMAL HIGH (ref 0.2–1.2)
Total Protein: 6.4 g/dL (ref 6.1–8.1)
eGFR: 99 mL/min/{1.73_m2} (ref >=60)

## 2022-02-22 LAB — CBC WITH DIFFERENTIAL/PLATELET
Absolute Monocytes: 418 cells/uL (ref 200–950)
Basophils Absolute: 48 cells/uL (ref 0–200)
Basophils Relative: 1.1 %
Eosinophils Absolute: 40 cells/uL (ref 15–500)
Eosinophils Relative: 0.9 %
HCT: 44.2 % (ref 35.0–45.0)
Hemoglobin: 15 g/dL (ref 11.7–15.5)
Lymphs Abs: 2125 cells/uL (ref 850–3900)
MCH: 29.8 pg (ref 27.0–33.0)
MCHC: 33.9 g/dL (ref 32.0–36.0)
MCV: 87.7 fL (ref 80.0–100.0)
MPV: 10.8 fL (ref 7.5–12.5)
Monocytes Relative: 9.5 %
Neutro Abs: 1769 cells/uL (ref 1500–7800)
Neutrophils Relative %: 40.2 %
Platelets: 224 10*3/uL (ref 140–400)
RBC: 5.04 10*6/uL (ref 3.80–5.10)
RDW: 13.4 % (ref 11.0–15.0)
Total Lymphocyte: 48.3 %
WBC: 4.4 10*3/uL (ref 3.8–10.8)

## 2022-02-22 NOTE — Progress Notes (Signed)
Total bilirubin is borderline elevated. Rest of CMP WNL.  CBC WNL.

## 2022-03-02 ENCOUNTER — Other Ambulatory Visit (HOSPITAL_COMMUNITY): Payer: Self-pay

## 2022-03-21 ENCOUNTER — Other Ambulatory Visit (HOSPITAL_COMMUNITY): Payer: Self-pay

## 2022-03-25 NOTE — Progress Notes (Unsigned)
Office Visit Note  Patient: Karen Bryant             Date of Birth: Apr 30, 1956           MRN: [id number]             PCP: Kirstie Peri, MD Referring: Kirstie Peri, MD Visit Date: 04/05/2022 Occupation: @GUAROCC @  Subjective:  No chief complaint on file.   History of Present Illness: Karen Bryant is a 66 y.o. female ***   Activities of Daily Living:  Patient reports morning stiffness for *** {minute/hour:19697}.   Patient {ACTIONS;DENIES/REPORTS:21021675::"Denies"} nocturnal pain.  Difficulty dressing/grooming: {ACTIONS;DENIES/REPORTS:21021675::"Denies"} Difficulty climbing stairs: {ACTIONS;DENIES/REPORTS:21021675::"Denies"} Difficulty getting out of chair: {ACTIONS;DENIES/REPORTS:21021675::"Denies"} Difficulty using hands for taps, buttons, cutlery, and/or writing: {ACTIONS;DENIES/REPORTS:21021675::"Denies"}  No Rheumatology ROS completed.   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   Essential hypertension 01/20/2017   History of depression 01/20/2017   Psoriatic arthropathy (HCC) 01/16/2017   Psoriasis 01/16/2017   Primary osteoarthritis of both feet 01/16/2017   Primary osteoarthritis of both hands 01/16/2017   Primary osteoarthritis of both knees 01/16/2017    Past Medical History:  Diagnosis Date   Psoriatic arthritis (HCC)     Family History  Problem Relation Age of Onset   Alzheimer's disease Mother    Heart failure Mother    Parkinson's disease Father    Cancer Father        prostate   Cancer Sister        breast cancer    Healthy Daughter    Healthy Daughter    Past Surgical History:  Procedure Laterality Date   CHOLECYSTECTOMY  03/10/2021   TUBAL LIGATION     Social History   Social History Narrative   Not on file   Immunization History  Administered Date(s) Administered   Moderna Sars-Covid-2 Vaccination 01/09/2020, 02/08/2020      Objective: Vital Signs: There were no vitals taken for this visit.   Physical Exam   Musculoskeletal Exam: ***  CDAI Exam: CDAI Score: -- Patient Global: --; Provider Global: -- Swollen: --; Tender: -- Joint Exam 04/05/2022   No joint exam has been documented for this visit   There is currently no information documented on the homunculus. Go to the Rheumatology activity and complete the homunculus joint exam.  Investigation: No additional findings.  Imaging: No results found.  Recent Labs: Lab Results  Component Value Date   WBC 4.4 02/21/2022   HGB 15.0 02/21/2022   PLT 224 02/21/2022   NA 139 02/21/2022   K 4.4 02/21/2022   CL 104 02/21/2022   CO2 26 02/21/2022   GLUCOSE 87 02/21/2022   BUN 12 02/21/2022   CREATININE 0.63 02/21/2022   BILITOT 1.3 (H) 02/21/2022   ALKPHOS 62 06/13/2017   AST 14 02/21/2022   ALT 14 02/21/2022   PROT 6.4 02/21/2022   ALBUMIN 4.1 06/13/2017   CALCIUM 9.2 02/21/2022   GFRAA 109 11/17/2020   QFTBGOLDPLUS NEGATIVE 06/01/2021    Speciality Comments: No specialty comments available.  Procedures:  No procedures performed Allergies: Patient has no known allergies.   Assessment / Plan:     Visit Diagnoses: Psoriatic arthropathy (HCC)  Psoriasis  High risk medication use  Primary osteoarthritis of both hands  Primary osteoarthritis of both knees  Primary osteoarthritis of both feet  DDD (degenerative disc disease), lumbar  Medial epicondylitis of elbow, right  Trochanteric bursitis,  left hip  Other fatigue  Vitamin D deficiency  History of hypertension  Orders: No orders of the defined types were placed in this encounter.  No orders of the defined types were placed in this encounter.   Face-to-face time spent with patient was *** minutes. Greater than 50% of time was spent in counseling and coordination of care.  Follow-Up Instructions: No follow-ups on file.   Gearldine Bienenstock, PA-C  Note - This record  has been created using Dragon software.  Chart creation errors have been sought, but may not always  have been located. Such creation errors do not reflect on  the standard of medical care.

## 2022-03-30 ENCOUNTER — Other Ambulatory Visit (HOSPITAL_COMMUNITY): Payer: Self-pay

## 2022-04-05 ENCOUNTER — Encounter: Payer: Self-pay | Admitting: Physician Assistant

## 2022-04-05 ENCOUNTER — Ambulatory Visit: Payer: BC Managed Care – PPO | Admitting: Physician Assistant

## 2022-04-05 VITALS — BP 160/93 | HR 79 | Ht 65.0 in | Wt 187.0 lb

## 2022-04-05 DIAGNOSIS — L4050 Arthropathic psoriasis, unspecified: Secondary | ICD-10-CM

## 2022-04-05 DIAGNOSIS — R5383 Other fatigue: Secondary | ICD-10-CM

## 2022-04-05 DIAGNOSIS — L4059 Other psoriatic arthropathy: Secondary | ICD-10-CM | POA: Diagnosis not present

## 2022-04-05 DIAGNOSIS — M19071 Primary osteoarthritis, right ankle and foot: Secondary | ICD-10-CM

## 2022-04-05 DIAGNOSIS — Z111 Encounter for screening for respiratory tuberculosis: Secondary | ICD-10-CM

## 2022-04-05 DIAGNOSIS — L409 Psoriasis, unspecified: Secondary | ICD-10-CM | POA: Diagnosis not present

## 2022-04-05 DIAGNOSIS — Z79899 Other long term (current) drug therapy: Secondary | ICD-10-CM | POA: Diagnosis not present

## 2022-04-05 DIAGNOSIS — E559 Vitamin D deficiency, unspecified: Secondary | ICD-10-CM

## 2022-04-05 DIAGNOSIS — M19042 Primary osteoarthritis, left hand: Secondary | ICD-10-CM

## 2022-04-05 DIAGNOSIS — M7701 Medial epicondylitis, right elbow: Secondary | ICD-10-CM

## 2022-04-05 DIAGNOSIS — M5136 Other intervertebral disc degeneration, lumbar region: Secondary | ICD-10-CM

## 2022-04-05 DIAGNOSIS — S32010A Wedge compression fracture of first lumbar vertebra, initial encounter for closed fracture: Secondary | ICD-10-CM

## 2022-04-05 DIAGNOSIS — M7062 Trochanteric bursitis, left hip: Secondary | ICD-10-CM

## 2022-04-05 DIAGNOSIS — M19072 Primary osteoarthritis, left ankle and foot: Secondary | ICD-10-CM

## 2022-04-05 DIAGNOSIS — M19041 Primary osteoarthritis, right hand: Secondary | ICD-10-CM

## 2022-04-05 DIAGNOSIS — M170 Bilateral primary osteoarthritis of knee: Secondary | ICD-10-CM

## 2022-04-05 DIAGNOSIS — Z8679 Personal history of other diseases of the circulatory system: Secondary | ICD-10-CM

## 2022-04-06 ENCOUNTER — Other Ambulatory Visit: Payer: Self-pay | Admitting: *Deleted

## 2022-04-06 LAB — VITAMIN D 25 HYDROXY (VIT D DEFICIENCY, FRACTURES): Vit D, 25-Hydroxy: 29 ng/mL — ABNORMAL LOW (ref 30–100)

## 2022-04-06 MED ORDER — VITAMIN D (ERGOCALCIFEROL) 1.25 MG (50000 UNIT) PO CAPS: 50000.0000 [IU] | ORAL_CAPSULE | ORAL | 0 refills | Status: AC

## 2022-04-06 NOTE — Progress Notes (Signed)
Vitamin D is low-29.  Please notify the patient and send in vitamin D 50,000 units once weekly x3 months. Recheck vitamin D in 3 months.

## 2022-04-07 ENCOUNTER — Other Ambulatory Visit: Payer: Self-pay | Admitting: *Deleted

## 2022-04-07 DIAGNOSIS — M5136 Other intervertebral disc degeneration, lumbar region: Secondary | ICD-10-CM

## 2022-04-07 DIAGNOSIS — S32010A Wedge compression fracture of first lumbar vertebra, initial encounter for closed fracture: Secondary | ICD-10-CM

## 2022-04-07 DIAGNOSIS — M51369 Other intervertebral disc degeneration, lumbar region without mention of lumbar back pain or lower extremity pain: Secondary | ICD-10-CM

## 2022-04-20 ENCOUNTER — Other Ambulatory Visit (HOSPITAL_COMMUNITY): Payer: Self-pay

## 2022-05-09 ENCOUNTER — Other Ambulatory Visit (HOSPITAL_COMMUNITY): Payer: Self-pay

## 2022-05-09 ENCOUNTER — Other Ambulatory Visit: Payer: Self-pay | Admitting: Pharmacist

## 2022-05-09 DIAGNOSIS — L409 Psoriasis, unspecified: Secondary | ICD-10-CM

## 2022-05-09 DIAGNOSIS — L4050 Arthropathic psoriasis, unspecified: Secondary | ICD-10-CM

## 2022-05-09 DIAGNOSIS — Z79899 Other long term (current) drug therapy: Secondary | ICD-10-CM

## 2022-05-09 MED ORDER — HUMIRA (2 PEN) 40 MG/0.4ML ~~LOC~~ AJKT
40.0000 mg | AUTO-INJECTOR | SUBCUTANEOUS | 0 refills | Status: DC
  Filled 2022-05-09: qty 2, 28d supply, fill #0

## 2022-05-09 NOTE — Telephone Encounter (Signed)
Rx for Humira 40mg  SQ every 14 days sent to [facility] for hospital-based cost pricing  Total qty remaining is 10ml  4m, PharmD, MPH, BCPS, CPP Clinical Pharmacist (Rheumatology and Pulmonology)

## 2022-05-10 ENCOUNTER — Other Ambulatory Visit (HOSPITAL_COMMUNITY): Payer: Self-pay

## 2022-06-01 ENCOUNTER — Other Ambulatory Visit (HOSPITAL_COMMUNITY): Payer: Self-pay

## 2022-06-06 ENCOUNTER — Other Ambulatory Visit (HOSPITAL_COMMUNITY): Payer: Self-pay

## 2022-06-06 ENCOUNTER — Other Ambulatory Visit: Payer: Self-pay | Admitting: Physician Assistant

## 2022-06-06 DIAGNOSIS — L409 Psoriasis, unspecified: Secondary | ICD-10-CM

## 2022-06-06 DIAGNOSIS — L4050 Arthropathic psoriasis, unspecified: Secondary | ICD-10-CM

## 2022-06-06 DIAGNOSIS — Z79899 Other long term (current) drug therapy: Secondary | ICD-10-CM

## 2022-06-06 MED ORDER — HUMIRA (2 PEN) 40 MG/0.4ML ~~LOC~~ AJKT
40.0000 mg | AUTO-INJECTOR | SUBCUTANEOUS | 0 refills | Status: DC
  Filled 2022-06-06 – 2022-06-07 (×2): qty 2, 28d supply, fill #0

## 2022-06-06 NOTE — Telephone Encounter (Signed)
Next Visit: 09/06/2022  Last Visit: 04/05/2022  Last Fill: 05/09/2022  DX: Psoriatic arthropathy   Current Dose per office note 04/05/2022: Humira 40 mg sq injections once every 14 days  Labs: 02/21/2022 Total bilirubin is borderline elevated. Rest of CMP WNL.  CBC WNL.   TB Gold: 06/01/2021 Neg    Left message to advise patient she is due to update labs.   Okay to refill Humira?

## 2022-06-07 ENCOUNTER — Telehealth: Payer: Self-pay | Admitting: Rheumatology

## 2022-06-07 ENCOUNTER — Other Ambulatory Visit (HOSPITAL_COMMUNITY): Payer: Self-pay

## 2022-06-07 ENCOUNTER — Telehealth: Payer: Self-pay | Admitting: Pharmacy Technician

## 2022-06-07 ENCOUNTER — Other Ambulatory Visit: Payer: Self-pay

## 2022-06-07 DIAGNOSIS — Z79899 Other long term (current) drug therapy: Secondary | ICD-10-CM

## 2022-06-07 DIAGNOSIS — Z111 Encounter for screening for respiratory tuberculosis: Secondary | ICD-10-CM

## 2022-06-07 NOTE — Telephone Encounter (Deleted)
Patient Advocate Encounter  Received notification that prior authorization for HUMIRA 40MG  is required.   PA submitted on 8.29.23 Key  [id number] Status is pending    12-04-1968, CPhT Patient Advocate Phone: [phone]

## 2022-06-07 NOTE — Telephone Encounter (Signed)
Lab orders released. I called patient and advised. Also advised patient that it is too early to recheck vitamin D level as that is due end of September 2023. She verbalized understanding.

## 2022-06-07 NOTE — Telephone Encounter (Signed)
ERROR

## 2022-06-07 NOTE — Progress Notes (Signed)
CBC WNL

## 2022-06-07 NOTE — Telephone Encounter (Signed)
Patient left a voicemail stating she is at [facility] in [place] now to get her labs done and they need orders.

## 2022-06-08 ENCOUNTER — Other Ambulatory Visit (HOSPITAL_COMMUNITY): Payer: Self-pay

## 2022-06-08 NOTE — Progress Notes (Signed)
CMP WNL

## 2022-06-08 NOTE — Telephone Encounter (Signed)
Patient Advocate Encounter  Prior Authorization for Karen Bryant has been approved.    PA# --- Effective dates: 08.29.23 through 8.27.24  Lindsea Olivar B. CPhT P: [phone] F: [phone]     Received notification that prior authorization for HUMIRA is required.   PA submitted on 8.29.23 Key [id number] Status is pending    Ricke Hey, CPhT Patient Advocate Phone: [phone]

## 2022-06-10 LAB — CBC WITH DIFFERENTIAL/PLATELET
Absolute Monocytes: 448 cells/uL (ref 200–950)
Basophils Absolute: 90 cells/uL (ref 0–200)
Basophils Relative: 1.6 %
Eosinophils Absolute: 78 cells/uL (ref 15–500)
Eosinophils Relative: 1.4 %
HCT: 44.4 % (ref 35.0–45.0)
Hemoglobin: 15 g/dL (ref 11.7–15.5)
Lymphs Abs: 2324 cells/uL (ref 850–3900)
MCH: 29.7 pg (ref 27.0–33.0)
MCHC: 33.8 g/dL (ref 32.0–36.0)
MCV: 87.9 fL (ref 80.0–100.0)
MPV: 10.6 fL (ref 7.5–12.5)
Monocytes Relative: 8 %
Neutro Abs: 2660 cells/uL (ref 1500–7800)
Neutrophils Relative %: 47.5 %
Platelets: 244 10*3/uL (ref 140–400)
RBC: 5.05 10*6/uL (ref 3.80–5.10)
RDW: 13.2 % (ref 11.0–15.0)
Total Lymphocyte: 41.5 %
WBC: 5.6 10*3/uL (ref 3.8–10.8)

## 2022-06-10 LAB — QUANTIFERON-TB GOLD PLUS
Mitogen-NIL: 6.06 IU/mL
NIL: 0.02 IU/mL
QuantiFERON-TB Gold Plus: NEGATIVE
TB1-NIL: 0 IU/mL
TB2-NIL: 0.01 IU/mL

## 2022-06-10 LAB — COMPLETE METABOLIC PANEL WITH GFR
AG Ratio: 1.8 (calc) (ref 1.0–2.5)
ALT: 13 U/L (ref 6–29)
AST: 14 U/L (ref 10–35)
Albumin: 4.2 g/dL (ref 3.6–5.1)
Alkaline phosphatase (APISO): 76 U/L (ref 37–153)
BUN: 17 mg/dL (ref 7–25)
CO2: 24 mmol/L (ref 20–32)
Calcium: 8.7 mg/dL (ref 8.6–10.4)
Chloride: 109 mmol/L (ref 98–110)
Creat: 0.59 mg/dL (ref 0.50–1.05)
Globulin: 2.3 g/dL (calc) (ref 1.9–3.7)
Glucose, Bld: 97 mg/dL (ref 65–99)
Potassium: 4.4 mmol/L (ref 3.5–5.3)
Sodium: 141 mmol/L (ref 135–146)
Total Bilirubin: 1 mg/dL (ref 0.2–1.2)
Total Protein: 6.5 g/dL (ref 6.1–8.1)
eGFR: 100 mL/min/{1.73_m2} (ref >=60)

## 2022-06-14 NOTE — Progress Notes (Signed)
TB gold negative

## 2022-06-30 ENCOUNTER — Other Ambulatory Visit: Payer: Self-pay | Admitting: Physician Assistant

## 2022-06-30 ENCOUNTER — Other Ambulatory Visit (HOSPITAL_COMMUNITY): Payer: Self-pay

## 2022-06-30 DIAGNOSIS — Z79899 Other long term (current) drug therapy: Secondary | ICD-10-CM

## 2022-06-30 DIAGNOSIS — L409 Psoriasis, unspecified: Secondary | ICD-10-CM

## 2022-06-30 DIAGNOSIS — L4050 Arthropathic psoriasis, unspecified: Secondary | ICD-10-CM

## 2022-06-30 MED ORDER — HUMIRA (2 PEN) 40 MG/0.4ML ~~LOC~~ AJKT
40.0000 mg | AUTO-INJECTOR | SUBCUTANEOUS | 0 refills | Status: DC
  Filled 2022-06-30: qty 2, 28d supply, fill #0
  Filled 2022-07-27: qty 2, 28d supply, fill #1
  Filled 2022-08-24: qty 2, 28d supply, fill #2

## 2022-06-30 NOTE — Telephone Encounter (Signed)
Next Visit: 09/06/2022  Last Visit: 04/05/2022  Last Fill: 06/06/2022  JJ:[id number] arthropathy  Current Dose per office note on 04/05/2022: Humira 40 mg sq injections once every 14 days.  Labs: 06/07/2022 CBC WNL, CMP WNL  TB Gold: 06/07/2022 negative    Okay to refill humira?

## 2022-07-06 ENCOUNTER — Other Ambulatory Visit (HOSPITAL_COMMUNITY): Payer: Self-pay

## 2022-07-27 ENCOUNTER — Other Ambulatory Visit (HOSPITAL_COMMUNITY): Payer: Self-pay

## 2022-08-03 ENCOUNTER — Other Ambulatory Visit (HOSPITAL_COMMUNITY): Payer: Self-pay

## 2022-08-23 NOTE — Progress Notes (Deleted)
Office Visit Note  Patient: Karen Bryant             Date of Birth: [birth year]           MRN: [id number]             PCP: Kirstie Peri, MD Referring: Kirstie Peri, MD Visit Date: 09/06/2022 Occupation: @GUAROCC @  Subjective:    History of Present Illness: Lavette Yankovich is a 66 y.o. female with history of psoriatic arthritis and osteoarthritis.  She is currently on Humira 40 mg sq injections once every 14 days.   CBC and CMP updated on 06/07/22. Orders for CBC and CMP released today.  Her next lab work will be due in February and every 3 months.  TB gold negative on 06/07/22.  Discussed the importance of holding humira if she develops signs or symptoms of an infection and to resume once the infection has completely cleared.   Activities of Daily Living:  Patient reports morning stiffness for *** {minute/hour:19697}.   Patient {ACTIONS;DENIES/REPORTS:21021675::"Denies"} nocturnal pain.  Difficulty dressing/grooming: {ACTIONS;DENIES/REPORTS:21021675::"Denies"} Difficulty climbing stairs: {ACTIONS;DENIES/REPORTS:21021675::"Denies"} Difficulty getting out of chair: {ACTIONS;DENIES/REPORTS:21021675::"Denies"} Difficulty using hands for taps, buttons, cutlery, and/or writing: {ACTIONS;DENIES/REPORTS:21021675::"Denies"}  No Rheumatology ROS completed.   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   Essential hypertension 01/20/2017   History of depression 01/20/2017   Psoriatic arthropathy (HCC) 01/16/2017   Psoriasis 01/16/2017   Primary osteoarthritis of both feet 01/16/2017   Primary osteoarthritis of both hands 01/16/2017   Primary osteoarthritis of both knees 01/16/2017    Past Medical History:  Diagnosis Date   Psoriatic arthritis (HCC)     Family History  Problem Relation Age of Onset   Alzheimer's disease Mother    Heart failure Mother    Parkinson's disease Father    Cancer  Father        prostate   Cancer Sister        breast cancer    Healthy Daughter    Healthy Daughter    Past Surgical History:  Procedure Laterality Date   CHOLECYSTECTOMY  03/10/2021   TUBAL LIGATION     Social History   Social History Narrative   Not on file   Immunization History  Administered Date(s) Administered   Moderna Sars-Covid-2 Vaccination 01/09/2020, 02/08/2020     Objective: Vital Signs: There were no vitals taken for this visit.   Physical Exam Vitals and nursing note reviewed.  Constitutional:      Appearance: She is well-developed.  HENT:     Head: Normocephalic and atraumatic.  Eyes:     Conjunctiva/sclera: Conjunctivae normal.  Cardiovascular:     Rate and Rhythm: Normal rate and regular rhythm.     Heart sounds: Normal heart sounds.  Pulmonary:     Effort: Pulmonary effort is normal.     Breath sounds: Normal breath sounds.  Abdominal:     General: Bowel sounds are normal.     Palpations: Abdomen is soft.  Musculoskeletal:     Cervical back: Normal range of motion.  Skin:    General: Skin is warm and dry.     Capillary Refill: Capillary refill takes less than 2 seconds.  Neurological:     Mental Status: She is alert and oriented to person, place, and time.  Psychiatric:        Behavior: Behavior normal.      Musculoskeletal Exam: ***  CDAI Exam: CDAI Score: -- Patient Global: --; Provider Global: -- Swollen: --; Tender: -- Joint Exam 09/06/2022   No joint exam has been documented for this visit   There is currently no information documented on the homunculus. Go to the Rheumatology activity and complete the homunculus joint exam.  Investigation: No additional findings.  Imaging: No results found.  Recent Labs: Lab Results  Component Value Date   WBC 5.6 06/07/2022   HGB 15.0 06/07/2022   PLT 244 06/07/2022   NA 141 06/07/2022   K 4.4 06/07/2022   CL 109 06/07/2022   CO2 24 06/07/2022   GLUCOSE 97 06/07/2022   BUN 17  06/07/2022   CREATININE 0.59 06/07/2022   BILITOT 1.0 06/07/2022   ALKPHOS 62 06/13/2017   AST 14 06/07/2022   ALT 13 06/07/2022   PROT 6.5 06/07/2022   ALBUMIN 4.1 06/13/2017   CALCIUM 8.7 06/07/2022   GFRAA 109 11/17/2020   QFTBGOLDPLUS NEGATIVE 06/07/2022    Speciality Comments: No specialty comments available.  Procedures:  No procedures performed Allergies: Patient has no known allergies.   Assessment / Plan:     Visit Diagnoses: No diagnosis found.  Orders: No orders of the defined types were placed in this encounter.  No orders of the defined types were placed in this encounter.   Face-to-face time spent with patient was *** minutes. Greater than 50% of time was spent in counseling and coordination of care.  Follow-Up Instructions: No follow-ups on file.   Ellen Henri, CMA  Note - This record has been created using Animal nutritionist.  Chart creation errors have been sought, but may not always  have been located. Such creation errors do not reflect on  the standard of medical care.

## 2022-08-24 ENCOUNTER — Other Ambulatory Visit (HOSPITAL_COMMUNITY): Payer: Self-pay

## 2022-08-30 ENCOUNTER — Other Ambulatory Visit (HOSPITAL_COMMUNITY): Payer: Self-pay

## 2022-09-06 ENCOUNTER — Ambulatory Visit: Payer: BC Managed Care – PPO | Admitting: Physician Assistant

## 2022-09-06 DIAGNOSIS — L409 Psoriasis, unspecified: Secondary | ICD-10-CM

## 2022-09-06 DIAGNOSIS — M19071 Primary osteoarthritis, right ankle and foot: Secondary | ICD-10-CM

## 2022-09-06 DIAGNOSIS — M170 Bilateral primary osteoarthritis of knee: Secondary | ICD-10-CM

## 2022-09-06 DIAGNOSIS — S32010A Wedge compression fracture of first lumbar vertebra, initial encounter for closed fracture: Secondary | ICD-10-CM

## 2022-09-06 DIAGNOSIS — M7062 Trochanteric bursitis, left hip: Secondary | ICD-10-CM

## 2022-09-06 DIAGNOSIS — M7701 Medial epicondylitis, right elbow: Secondary | ICD-10-CM

## 2022-09-06 DIAGNOSIS — R5383 Other fatigue: Secondary | ICD-10-CM

## 2022-09-06 DIAGNOSIS — M5136 Other intervertebral disc degeneration, lumbar region: Secondary | ICD-10-CM

## 2022-09-06 DIAGNOSIS — M19041 Primary osteoarthritis, right hand: Secondary | ICD-10-CM

## 2022-09-06 DIAGNOSIS — E559 Vitamin D deficiency, unspecified: Secondary | ICD-10-CM

## 2022-09-06 DIAGNOSIS — L4050 Arthropathic psoriasis, unspecified: Secondary | ICD-10-CM

## 2022-09-06 DIAGNOSIS — Z79899 Other long term (current) drug therapy: Secondary | ICD-10-CM

## 2022-09-06 DIAGNOSIS — Z8679 Personal history of other diseases of the circulatory system: Secondary | ICD-10-CM

## 2022-09-21 ENCOUNTER — Other Ambulatory Visit (HOSPITAL_COMMUNITY): Payer: Self-pay

## 2022-09-21 ENCOUNTER — Telehealth: Payer: Self-pay | Admitting: Rheumatology

## 2022-09-21 ENCOUNTER — Other Ambulatory Visit: Payer: Self-pay | Admitting: Rheumatology

## 2022-09-21 ENCOUNTER — Other Ambulatory Visit: Payer: Self-pay

## 2022-09-21 ENCOUNTER — Other Ambulatory Visit: Payer: Self-pay | Admitting: *Deleted

## 2022-09-21 DIAGNOSIS — Z79899 Other long term (current) drug therapy: Secondary | ICD-10-CM

## 2022-09-21 DIAGNOSIS — L409 Psoriasis, unspecified: Secondary | ICD-10-CM

## 2022-09-21 DIAGNOSIS — L4050 Arthropathic psoriasis, unspecified: Secondary | ICD-10-CM

## 2022-09-21 NOTE — Telephone Encounter (Signed)
Patient called the office requesting lab orders be sent to [facility] in [place]. Patient is going sometime this week.

## 2022-09-21 NOTE — Telephone Encounter (Signed)
Lab Orders released.  

## 2022-10-14 NOTE — Progress Notes (Deleted)
Office Visit Note  Patient: Karen Bryant             Date of Birth: Aug 13, 1956           MRN: [id number]             PCP: Monico Blitz, MD Referring: Monico Blitz, MD Visit Date: 10/19/2022 Occupation: @GUAROCC$ @  Subjective:    History of Present Illness: Karen Bryant is a 67 y.o. female with history of psoriatic arthritis, osteoarthritis, and DDD.  Patient remains on humira 40 mg sq injectiosn every 14 days.     CBC and CMP WNL on 06/07/22. TB gold negative on 06/07/22.  Discussed the importance of an infection and to resume once the infection has completely cleared.  Activities of Daily Living:  Patient reports morning stiffness for *** {minute/hour:19697}.   Patient {ACTIONS;DENIES/REPORTS:21021675::"Denies"} nocturnal pain.  Difficulty dressing/grooming: {ACTIONS;DENIES/REPORTS:21021675::"Denies"} Difficulty climbing stairs: {ACTIONS;DENIES/REPORTS:21021675::"Denies"} Difficulty getting out of chair: {ACTIONS;DENIES/REPORTS:21021675::"Denies"} Difficulty using hands for taps, buttons, cutlery, and/or writing: {ACTIONS;DENIES/REPORTS:21021675::"Denies"}  No Rheumatology ROS completed.   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   Essential hypertension 01/20/2017   History of depression 01/20/2017   Psoriatic arthropathy ([place]) 01/16/2017   Psoriasis 01/16/2017   Primary osteoarthritis of both feet 01/16/2017   Primary osteoarthritis of both hands 01/16/2017   Primary osteoarthritis of both knees 01/16/2017    Past Medical History:  Diagnosis Date   Psoriatic arthritis ([place])     Family History  Problem Relation Age of Onset   Alzheimer's disease Mother    Heart failure Mother    Parkinson's disease Father    Cancer Father        prostate   Cancer Sister        breast cancer    Healthy Daughter    Healthy Daughter    Past Surgical History:  Procedure Laterality  Date   CHOLECYSTECTOMY  03/10/2021   TUBAL LIGATION     Social History   Social History Narrative   Not on file   Immunization History  Administered Date(s) Administered   Moderna Sars-Covid-2 Vaccination 01/09/2020, 02/08/2020     Objective: Vital Signs: There were no vitals taken for this visit.   Physical Exam Vitals and nursing note reviewed.  Constitutional:      Appearance: She is well-developed.  HENT:     Head: Normocephalic and atraumatic.  Eyes:     Conjunctiva/sclera: Conjunctivae normal.  Cardiovascular:     Rate and Rhythm: Normal rate and regular rhythm.     Heart sounds: Normal heart sounds.  Pulmonary:     Effort: Pulmonary effort is normal.     Breath sounds: Normal breath sounds.  Abdominal:     General: Bowel sounds are normal.     Palpations: Abdomen is soft.  Musculoskeletal:     Cervical back: Normal range of motion.  Skin:    General: Skin is warm and dry.     Capillary Refill: Capillary refill takes less than 2 seconds.  Neurological:     Mental Status: She is alert and oriented to person, place, and time.  Psychiatric:        Behavior: Behavior normal.      Musculoskeletal Exam: ***  CDAI Exam: CDAI Score: -- Patient Global: --; Provider Global: -- Swollen: --; Tender: -- Joint Exam 10/19/2022   No joint exam has been documented for this visit  There is currently no information documented on the homunculus. Go to the Rheumatology activity and complete the homunculus joint exam.  Investigation: No additional findings.  Imaging: No results found.  Recent Labs: Lab Results  Component Value Date   WBC 5.6 06/07/2022   HGB 15.0 06/07/2022   PLT 244 06/07/2022   NA 141 06/07/2022   K 4.4 06/07/2022   CL 109 06/07/2022   CO2 24 06/07/2022   GLUCOSE 97 06/07/2022   BUN 17 06/07/2022   CREATININE 0.59 06/07/2022   BILITOT 1.0 06/07/2022   ALKPHOS 62 06/13/2017   AST 14 06/07/2022   ALT 13 06/07/2022   PROT 6.5  06/07/2022   ALBUMIN 4.1 06/13/2017   CALCIUM 8.7 06/07/2022   GFRAA 109 11/17/2020   QFTBGOLDPLUS NEGATIVE 06/07/2022    Speciality Comments: No specialty comments available.  Procedures:  No procedures performed Allergies: Patient has no known allergies.   Assessment / Plan:     Visit Diagnoses: Psoriatic arthropathy ([place])  Psoriasis  High risk medication use  DDD (degenerative disc disease), lumbar  Closed compression fracture of body of L1 vertebra (HCC)  Primary osteoarthritis of both hands  Primary osteoarthritis of both knees  Primary osteoarthritis of both feet  Trochanteric bursitis, left hip  Medial epicondylitis of elbow, right  Other fatigue  Vitamin D deficiency  History of hypertension  Orders: No orders of the defined types were placed in this encounter.  No orders of the defined types were placed in this encounter.   Face-to-face time spent with patient was *** minutes. Greater than 50% of time was spent in counseling and coordination of care.  Follow-Up Instructions: No follow-ups on file.   Ofilia Neas, PA-C  Note - This record has been created using Dragon software.  Chart creation errors have been sought, but may not always  have been located. Such creation errors do not reflect on  the standard of medical care.

## 2022-10-19 ENCOUNTER — Ambulatory Visit: Payer: BC Managed Care – PPO | Attending: Physician Assistant | Admitting: Physician Assistant

## 2022-10-19 DIAGNOSIS — E559 Vitamin D deficiency, unspecified: Secondary | ICD-10-CM

## 2022-10-19 DIAGNOSIS — Z79899 Other long term (current) drug therapy: Secondary | ICD-10-CM

## 2022-10-19 DIAGNOSIS — M7701 Medial epicondylitis, right elbow: Secondary | ICD-10-CM

## 2022-10-19 DIAGNOSIS — L4050 Arthropathic psoriasis, unspecified: Secondary | ICD-10-CM

## 2022-10-19 DIAGNOSIS — M5136 Other intervertebral disc degeneration, lumbar region: Secondary | ICD-10-CM

## 2022-10-19 DIAGNOSIS — M19071 Primary osteoarthritis, right ankle and foot: Secondary | ICD-10-CM

## 2022-10-19 DIAGNOSIS — S32010A Wedge compression fracture of first lumbar vertebra, initial encounter for closed fracture: Secondary | ICD-10-CM

## 2022-10-19 DIAGNOSIS — L409 Psoriasis, unspecified: Secondary | ICD-10-CM

## 2022-10-19 DIAGNOSIS — Z8679 Personal history of other diseases of the circulatory system: Secondary | ICD-10-CM

## 2022-10-19 DIAGNOSIS — M170 Bilateral primary osteoarthritis of knee: Secondary | ICD-10-CM

## 2022-10-19 DIAGNOSIS — M19041 Primary osteoarthritis, right hand: Secondary | ICD-10-CM

## 2022-10-19 DIAGNOSIS — R5383 Other fatigue: Secondary | ICD-10-CM

## 2022-10-19 DIAGNOSIS — M7062 Trochanteric bursitis, left hip: Secondary | ICD-10-CM

## 2022-11-09 ENCOUNTER — Other Ambulatory Visit (HOSPITAL_COMMUNITY): Payer: Self-pay

## 2022-11-11 ENCOUNTER — Other Ambulatory Visit (HOSPITAL_COMMUNITY): Payer: Self-pay

## 2022-11-11 NOTE — Progress Notes (Unsigned)
Office Visit Note  Patient: Karen Bryant             Date of Birth: June 24, 1956           MRN: [id number]             PCP: Monico Blitz, MD Referring: Monico Blitz, MD Visit Date: 11/14/2022 Occupation: @GUAROCC @  Subjective:  No chief complaint on file.   History of Present Illness: Karen Bryant is a 67 y.o. female ***     Activities of Daily Living:  Patient reports morning stiffness for *** {minute/hour:19697}.   Patient {ACTIONS;DENIES/REPORTS:21021675::"Denies"} nocturnal pain.  Difficulty dressing/grooming: {ACTIONS;DENIES/REPORTS:21021675::"Denies"} Difficulty climbing stairs: {ACTIONS;DENIES/REPORTS:21021675::"Denies"} Difficulty getting out of chair: {ACTIONS;DENIES/REPORTS:21021675::"Denies"} Difficulty using hands for taps, buttons, cutlery, and/or writing: {ACTIONS;DENIES/REPORTS:21021675::"Denies"}  No Rheumatology ROS completed.   PMFS History:  Patient Active Problem List   Diagnosis Date Noted  . High risk medication use 01/20/2017  . 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 Essential hypertension 01/20/2017  . History of depression 01/20/2017  . Psoriatic arthropathy ([place]) 01/16/2017  . Psoriasis 01/16/2017  . Primary osteoarthritis of both feet 01/16/2017  . Primary osteoarthritis of both hands 01/16/2017  . Primary osteoarthritis of both knees 01/16/2017    Past Medical History:  Diagnosis Date  . Psoriatic arthritis ([place])     Family History  Problem Relation Age of Onset  . Alzheimer's disease Mother   . Heart failure Mother   . Parkinson's disease Father   . Cancer Father        prostate  . Cancer Sister        breast cancer   . Healthy Daughter   . Healthy Daughter    Past Surgical History:  Procedure Laterality Date  . CHOLECYSTECTOMY  03/10/2021  . TUBAL LIGATION     Social History   Social History Narrative  . Not on file   Immunization History  Administered Date(s) Administered  . Moderna Sars-Covid-2 Vaccination 01/09/2020,  02/08/2020     Objective: Vital Signs: There were no vitals taken for this visit.   Physical Exam   Musculoskeletal Exam: ***  CDAI Exam: CDAI Score: -- Patient Global: --; Provider Global: -- Swollen: --; Tender: -- Joint Exam 11/14/2022   No joint exam has been documented for this visit   There is currently no information documented on the homunculus. Go to the Rheumatology activity and complete the homunculus joint exam.  Investigation: No additional findings.  Imaging: No results found.  Recent Labs: Lab Results  Component Value Date   WBC 5.6 06/07/2022   HGB 15.0 06/07/2022   PLT 244 06/07/2022   NA 141 06/07/2022   K 4.4 06/07/2022   CL 109 06/07/2022   CO2 24 06/07/2022   GLUCOSE 97 06/07/2022   BUN 17 06/07/2022   CREATININE 0.59 06/07/2022   BILITOT 1.0 06/07/2022   ALKPHOS 62 06/13/2017   AST 14 06/07/2022   ALT 13 06/07/2022   PROT 6.5 06/07/2022   ALBUMIN 4.1 06/13/2017   CALCIUM 8.7 06/07/2022   GFRAA 109 11/17/2020   QFTBGOLDPLUS NEGATIVE 06/07/2022    Speciality Comments: No specialty comments available.  Procedures:  No procedures performed Allergies: Patient has no known allergies.   Assessment / Plan:     Visit Diagnoses: No diagnosis found.  Orders: No orders of the defined types were placed in this encounter.  No orders of the defined types were placed in this encounter.   Face-to-face time spent with patient was *** minutes. Greater than 50% of  time was spent in counseling and coordination of care.  Follow-Up Instructions: No follow-ups on file.   Earnestine Mealing, CMA  Note - This record has been created using Editor, commissioning.  Chart creation errors have been sought, but may not always  have been located. Such creation errors do not reflect on  the standard of medical care.

## 2022-11-14 ENCOUNTER — Ambulatory Visit: Payer: BC Managed Care – PPO | Attending: Physician Assistant | Admitting: Physician Assistant

## 2022-11-14 ENCOUNTER — Other Ambulatory Visit: Payer: Self-pay

## 2022-11-14 ENCOUNTER — Telehealth: Payer: Self-pay

## 2022-11-14 ENCOUNTER — Other Ambulatory Visit (HOSPITAL_COMMUNITY): Payer: Self-pay

## 2022-11-14 ENCOUNTER — Encounter: Payer: Self-pay | Admitting: Physician Assistant

## 2022-11-14 VITALS — BP 158/80 | HR 78 | Resp 16 | Ht 65.0 in | Wt 222.0 lb

## 2022-11-14 DIAGNOSIS — M19041 Primary osteoarthritis, right hand: Secondary | ICD-10-CM | POA: Diagnosis not present

## 2022-11-14 DIAGNOSIS — S32010A Wedge compression fracture of first lumbar vertebra, initial encounter for closed fracture: Secondary | ICD-10-CM

## 2022-11-14 DIAGNOSIS — E559 Vitamin D deficiency, unspecified: Secondary | ICD-10-CM

## 2022-11-14 DIAGNOSIS — L409 Psoriasis, unspecified: Secondary | ICD-10-CM

## 2022-11-14 DIAGNOSIS — M19071 Primary osteoarthritis, right ankle and foot: Secondary | ICD-10-CM

## 2022-11-14 DIAGNOSIS — Z8679 Personal history of other diseases of the circulatory system: Secondary | ICD-10-CM

## 2022-11-14 DIAGNOSIS — M5136 Other intervertebral disc degeneration, lumbar region: Secondary | ICD-10-CM

## 2022-11-14 DIAGNOSIS — R5383 Other fatigue: Secondary | ICD-10-CM

## 2022-11-14 DIAGNOSIS — Z79899 Other long term (current) drug therapy: Secondary | ICD-10-CM

## 2022-11-14 DIAGNOSIS — M19042 Primary osteoarthritis, left hand: Secondary | ICD-10-CM

## 2022-11-14 DIAGNOSIS — M7701 Medial epicondylitis, right elbow: Secondary | ICD-10-CM

## 2022-11-14 DIAGNOSIS — M7062 Trochanteric bursitis, left hip: Secondary | ICD-10-CM

## 2022-11-14 DIAGNOSIS — L4050 Arthropathic psoriasis, unspecified: Secondary | ICD-10-CM | POA: Diagnosis not present

## 2022-11-14 DIAGNOSIS — Z13820 Encounter for screening for osteoporosis: Secondary | ICD-10-CM

## 2022-11-14 DIAGNOSIS — M19072 Primary osteoarthritis, left ankle and foot: Secondary | ICD-10-CM

## 2022-11-14 DIAGNOSIS — M170 Bilateral primary osteoarthritis of knee: Secondary | ICD-10-CM

## 2022-11-14 MED ORDER — HUMIRA (2 PEN) 40 MG/0.4ML ~~LOC~~ AJKT
40.0000 mg | AUTO-INJECTOR | SUBCUTANEOUS | 0 refills | Status: DC
  Filled 2022-11-14: qty 2, 28d supply, fill #0
  Filled 2022-12-01: qty 2, 28d supply, fill #1
  Filled 2023-01-24: qty 2, 28d supply, fill #2

## 2022-11-14 MED ORDER — PREDNISONE 5 MG PO TABS: ORAL_TABLET | ORAL | 0 refills | Status: DC

## 2022-11-14 NOTE — Progress Notes (Signed)
RBC count and hematocrit are borderline elevated. Rest of CBC WNL.

## 2022-11-14 NOTE — Telephone Encounter (Signed)
Left message with PCPs office requesting a copy of recent DEXA scan.

## 2022-11-15 LAB — COMPLETE METABOLIC PANEL WITH GFR
AG Ratio: 1.4 (calc) (ref 1.0–2.5)
ALT: 14 U/L (ref 6–29)
AST: 15 U/L (ref 10–35)
Albumin: 4.2 g/dL (ref 3.6–5.1)
Alkaline phosphatase (APISO): 79 U/L (ref 37–153)
BUN: 12 mg/dL (ref 7–25)
CO2: 25 mmol/L (ref 20–32)
Calcium: 9.8 mg/dL (ref 8.6–10.4)
Chloride: 103 mmol/L (ref 98–110)
Creat: 0.74 mg/dL (ref 0.50–1.05)
Globulin: 2.9 g/dL (calc) (ref 1.9–3.7)
Glucose, Bld: 102 mg/dL — ABNORMAL HIGH (ref 65–99)
Potassium: 4.5 mmol/L (ref 3.5–5.3)
Sodium: 139 mmol/L (ref 135–146)
Total Bilirubin: 0.9 mg/dL (ref 0.2–1.2)
Total Protein: 7.1 g/dL (ref 6.1–8.1)
eGFR: 90 mL/min/{1.73_m2} (ref >=60)

## 2022-11-15 LAB — CBC WITH DIFFERENTIAL/PLATELET
Absolute Monocytes: 559 cells/uL (ref 200–950)
Basophils Absolute: 80 cells/uL (ref 0–200)
Basophils Relative: 1.4 %
Eosinophils Absolute: 51 cells/uL (ref 15–500)
Eosinophils Relative: 0.9 %
HCT: 45.5 % — ABNORMAL HIGH (ref 35.0–45.0)
Hemoglobin: 15.5 g/dL (ref 11.7–15.5)
Lymphs Abs: 2035 cells/uL (ref 850–3900)
MCH: 29.2 pg (ref 27.0–33.0)
MCHC: 34.1 g/dL (ref 32.0–36.0)
MCV: 85.7 fL (ref 80.0–100.0)
MPV: 10.8 fL (ref 7.5–12.5)
Monocytes Relative: 9.8 %
Neutro Abs: 2975 cells/uL (ref 1500–7800)
Neutrophils Relative %: 52.2 %
Platelets: 245 10*3/uL (ref 140–400)
RBC: 5.31 10*6/uL — ABNORMAL HIGH (ref 3.80–5.10)
RDW: 13 % (ref 11.0–15.0)
Total Lymphocyte: 35.7 %
WBC: 5.7 10*3/uL (ref 3.8–10.8)

## 2022-11-15 NOTE — Progress Notes (Signed)
Glucose is 102. Rest of CMP WNL

## 2022-11-16 ENCOUNTER — Telehealth: Payer: Self-pay | Admitting: *Deleted

## 2022-11-16 NOTE — Telephone Encounter (Signed)
Received DEXA results from [facility].  Date of Scan: 04/26/2022  Lowest T-score:-2.9  BMD:0.637  Lowest site measured:Left Femoral Neck  DX: Osteoporosis  Significant changes in BMD and site measured (5% and above):n/a  Current Regimen:Calcium and Vitamin D  Recommendation:Discuss treatment options at follow up visit  Reviewed by: Hazel Sams, PA-C  Next Appointment:  04/18/2023

## 2022-12-01 ENCOUNTER — Other Ambulatory Visit (HOSPITAL_COMMUNITY): Payer: Self-pay

## 2022-12-06 ENCOUNTER — Other Ambulatory Visit (HOSPITAL_COMMUNITY): Payer: Self-pay

## 2022-12-13 ENCOUNTER — Other Ambulatory Visit (HOSPITAL_BASED_OUTPATIENT_CLINIC_OR_DEPARTMENT_OTHER): Payer: Self-pay

## 2022-12-29 ENCOUNTER — Other Ambulatory Visit (HOSPITAL_COMMUNITY): Payer: Self-pay

## 2023-01-02 ENCOUNTER — Other Ambulatory Visit: Payer: Self-pay

## 2023-01-04 ENCOUNTER — Other Ambulatory Visit (HOSPITAL_COMMUNITY): Payer: Self-pay

## 2023-01-05 ENCOUNTER — Other Ambulatory Visit: Payer: Self-pay

## 2023-01-24 ENCOUNTER — Other Ambulatory Visit (HOSPITAL_COMMUNITY): Payer: Self-pay

## 2023-01-24 ENCOUNTER — Other Ambulatory Visit: Payer: Self-pay

## 2023-02-15 ENCOUNTER — Other Ambulatory Visit: Payer: Self-pay

## 2023-02-15 ENCOUNTER — Other Ambulatory Visit (HOSPITAL_COMMUNITY): Payer: Self-pay

## 2023-02-15 ENCOUNTER — Other Ambulatory Visit: Payer: Self-pay | Admitting: Physician Assistant

## 2023-02-15 DIAGNOSIS — L4050 Arthropathic psoriasis, unspecified: Secondary | ICD-10-CM

## 2023-02-15 DIAGNOSIS — Z79899 Other long term (current) drug therapy: Secondary | ICD-10-CM

## 2023-02-15 DIAGNOSIS — L409 Psoriasis, unspecified: Secondary | ICD-10-CM

## 2023-02-15 MED ORDER — HUMIRA (2 PEN) 40 MG/0.4ML ~~LOC~~ AJKT
40.0000 mg | AUTO-INJECTOR | SUBCUTANEOUS | 0 refills | Status: DC
  Filled 2023-02-15: qty 2, 28d supply, fill #0
  Filled 2023-03-22: qty 2, 28d supply, fill #1
  Filled 2023-04-14: qty 2, 28d supply, fill #2

## 2023-02-15 NOTE — Telephone Encounter (Signed)
Last Fill: 11/14/2022  Labs: 11/14/2022 RBC count and hematocrit are borderline elevated. Rest of CBC WNL.  Glucose is 102. Rest of CMP WNL   TB Gold: 06/08/2023   Next Visit: 04/18/2023  Last Visit: 11/14/2022  DX: Psoriatic arthropathy   Current Dose per office note 11/14/2022: Humira 40 mg sq injections once every 14 days.   Patient advised she is due to update labs. Patient states she will update them Friday. Orders released.   Okay to refill Humira?

## 2023-02-17 ENCOUNTER — Other Ambulatory Visit: Payer: Self-pay

## 2023-02-17 LAB — CBC WITH DIFFERENTIAL/PLATELET
HCT: 41.2 % (ref 35.0–45.0)
MCHC: 34.2 g/dL (ref 32.0–36.0)
MCV: 84.8 fL (ref 80.0–100.0)
RDW: 13.8 % (ref 11.0–15.0)

## 2023-02-18 LAB — CBC WITH DIFFERENTIAL/PLATELET
Absolute Monocytes: 576 cells/uL (ref 200–950)
Basophils Absolute: 67 cells/uL (ref 0–200)
Basophils Relative: 1 %
Eosinophils Absolute: 60 cells/uL (ref 15–500)
Eosinophils Relative: 0.9 %
Hemoglobin: 14.1 g/dL (ref 11.7–15.5)
Lymphs Abs: 1588 cells/uL (ref 850–3900)
MCH: 29 pg (ref 27.0–33.0)
MPV: 10.7 fL (ref 7.5–12.5)
Monocytes Relative: 8.6 %
Neutro Abs: 4409 cells/uL (ref 1500–7800)
Neutrophils Relative %: 65.8 %
Platelets: 203 10*3/uL (ref 140–400)
RBC: 4.86 10*6/uL (ref 3.80–5.10)
Total Lymphocyte: 23.7 %
WBC: 6.7 10*3/uL (ref 3.8–10.8)

## 2023-02-18 LAB — COMPLETE METABOLIC PANEL WITH GFR
AG Ratio: 1.6 (calc) (ref 1.0–2.5)
ALT: 16 U/L (ref 6–29)
AST: 15 U/L (ref 10–35)
Albumin: 3.9 g/dL (ref 3.6–5.1)
Alkaline phosphatase (APISO): 76 U/L (ref 37–153)
BUN: 14 mg/dL (ref 7–25)
CO2: 24 mmol/L (ref 20–32)
Calcium: 8.9 mg/dL (ref 8.6–10.4)
Chloride: 105 mmol/L (ref 98–110)
Creat: 0.74 mg/dL (ref 0.50–1.05)
Globulin: 2.4 g/dL (calc) (ref 1.9–3.7)
Glucose, Bld: 112 mg/dL — ABNORMAL HIGH (ref 65–99)
Potassium: 4.4 mmol/L (ref 3.5–5.3)
Sodium: 139 mmol/L (ref 135–146)
Total Bilirubin: 0.9 mg/dL (ref 0.2–1.2)
Total Protein: 6.3 g/dL (ref 6.1–8.1)
eGFR: 90 mL/min/{1.73_m2} (ref >=60)

## 2023-02-20 ENCOUNTER — Other Ambulatory Visit (HOSPITAL_COMMUNITY): Payer: Self-pay

## 2023-02-20 NOTE — Progress Notes (Signed)
Glucose is 112. Rest of CMP WNL.  CBC WNL.

## 2023-03-17 ENCOUNTER — Other Ambulatory Visit (HOSPITAL_COMMUNITY): Payer: Self-pay

## 2023-03-20 ENCOUNTER — Other Ambulatory Visit (HOSPITAL_COMMUNITY): Payer: Self-pay

## 2023-03-22 ENCOUNTER — Other Ambulatory Visit (HOSPITAL_COMMUNITY): Payer: Self-pay

## 2023-03-22 ENCOUNTER — Other Ambulatory Visit: Payer: Self-pay

## 2023-04-04 NOTE — Progress Notes (Deleted)
Office Visit Note  Patient: Karen Bryant             Date of Birth: 04-09-1956           MRN: [id number]             PCP: Kirstie Peri, MD Referring: Kirstie Peri, MD Visit Date: 04/18/2023 Occupation: @GUAROCC @  Subjective:    History of Present Illness: Gerane Durnil is a 67 y.o. female with history of psoriatic arthritis, osteoarthritis, and DDD.  Patient remains on Humira 40 mg sq injections once every 14 days   CBC and CMP updated on 02/17/23.  Her next lab work will be due in August and every 3 months.  TB gold negative on 06/07/22.  Future order for TB gold placed today.  Discussed the importance of holding humira if she develops signs or symptoms of an infection and to resume once the infection has completely cleared.    Activities of Daily Living:  Patient reports morning stiffness for *** {minute/hour:19697}.   Patient {ACTIONS;DENIES/REPORTS:21021675::"Denies"} nocturnal pain.  Difficulty dressing/grooming: {ACTIONS;DENIES/REPORTS:21021675::"Denies"} Difficulty climbing stairs: {ACTIONS;DENIES/REPORTS:21021675::"Denies"} Difficulty getting out of chair: {ACTIONS;DENIES/REPORTS:21021675::"Denies"} Difficulty using hands for taps, buttons, cutlery, and/or writing: {ACTIONS;DENIES/REPORTS:21021675::"Denies"}  No Rheumatology ROS completed.   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   Essential hypertension 01/20/2017   History of depression 01/20/2017   Psoriatic arthropathy (HCC) 01/16/2017   Psoriasis 01/16/2017   Primary osteoarthritis of both feet 01/16/2017   Primary osteoarthritis of both hands 01/16/2017   Primary osteoarthritis of both knees 01/16/2017    Past Medical History:  Diagnosis Date   Psoriatic arthritis (HCC)     Family History  Problem Relation Age of Onset   Alzheimer's disease Mother    Heart failure Mother    Parkinson's disease Father     Cancer Father        prostate   Cancer Sister        breast cancer    Healthy Daughter    Healthy Daughter    Past Surgical History:  Procedure Laterality Date   CHOLECYSTECTOMY  03/10/2021   TUBAL LIGATION     Social History   Social History Narrative   Not on file   Immunization History  Administered Date(s) Administered   Moderna Sars-Covid-2 Vaccination 01/09/2020, 02/08/2020     Objective: Vital Signs: There were no vitals taken for this visit.   Physical Exam Vitals and nursing note reviewed.  Constitutional:      Appearance: She is well-developed.  HENT:     Head: Normocephalic and atraumatic.  Eyes:     Conjunctiva/sclera: Conjunctivae normal.  Cardiovascular:     Rate and Rhythm: Normal rate and regular rhythm.     Heart sounds: Normal heart sounds.  Pulmonary:     Effort: Pulmonary effort is normal.     Breath sounds: Normal breath sounds.  Abdominal:     General: Bowel sounds are normal.     Palpations: Abdomen is soft.  Musculoskeletal:     Cervical back: Normal range of motion.  Lymphadenopathy:     Cervical: No cervical adenopathy.  Skin:    General: Skin is warm and dry.     Capillary Refill: Capillary refill takes less than 2 seconds.  Neurological:     Mental Status: She is alert and oriented to person, place, and time.  Psychiatric:  Behavior: Behavior normal.      Musculoskeletal Exam: ***  CDAI Exam: CDAI Score: -- Patient Global: --; Provider Global: -- Swollen: --; Tender: -- Joint Exam 04/18/2023   No joint exam has been documented for this visit   There is currently no information documented on the homunculus. Go to the Rheumatology activity and complete the homunculus joint exam.  Investigation: No additional findings.  Imaging: No results found.  Recent Labs: Lab Results  Component Value Date   WBC 6.7 02/17/2023   HGB 14.1 02/17/2023   PLT 203 02/17/2023   NA 139 02/17/2023   K 4.4 02/17/2023   CL 105  02/17/2023   CO2 24 02/17/2023   GLUCOSE 112 (H) 02/17/2023   BUN 14 02/17/2023   CREATININE 0.74 02/17/2023   BILITOT 0.9 02/17/2023   ALKPHOS 62 06/13/2017   AST 15 02/17/2023   ALT 16 02/17/2023   PROT 6.3 02/17/2023   ALBUMIN 4.1 06/13/2017   CALCIUM 8.9 02/17/2023   GFRAA 109 11/17/2020   QFTBGOLDPLUS NEGATIVE 06/07/2022    Speciality Comments: No specialty comments available.  Procedures:  No procedures performed Allergies: Patient has no known allergies.   Assessment / Plan:     Visit Diagnoses: Psoriatic arthropathy (HCC)  Psoriasis  High risk medication use  Primary osteoarthritis of both hands  Primary osteoarthritis of both knees  Primary osteoarthritis of both feet  DDD (degenerative disc disease), lumbar  Medial epicondylitis of elbow, right  Trochanteric bursitis, left hip  Closed compression fracture of body of L1 vertebra (HCC)  Vitamin D deficiency  History of hypertension  Other fatigue  Orders: No orders of the defined types were placed in this encounter.  No orders of the defined types were placed in this encounter.   Face-to-face time spent with patient was *** minutes. Greater than 50% of time was spent in counseling and coordination of care.  Follow-Up Instructions: No follow-ups on file.   Gearldine Bienenstock, PA-C  Note - This record has been created using Dragon software.  Chart creation errors have been sought, but may not always  have been located. Such creation errors do not reflect on  the standard of medical care.

## 2023-04-11 DIAGNOSIS — Z79899 Other long term (current) drug therapy: Secondary | ICD-10-CM | POA: Diagnosis not present

## 2023-04-11 DIAGNOSIS — I10 Essential (primary) hypertension: Secondary | ICD-10-CM | POA: Diagnosis not present

## 2023-04-11 DIAGNOSIS — Z1331 Encounter for screening for depression: Secondary | ICD-10-CM | POA: Diagnosis not present

## 2023-04-11 DIAGNOSIS — Z299 Encounter for prophylactic measures, unspecified: Secondary | ICD-10-CM | POA: Diagnosis not present

## 2023-04-11 DIAGNOSIS — E7800 Pure hypercholesterolemia, unspecified: Secondary | ICD-10-CM | POA: Diagnosis not present

## 2023-04-11 DIAGNOSIS — E038 Other specified hypothyroidism: Secondary | ICD-10-CM | POA: Diagnosis not present

## 2023-04-11 DIAGNOSIS — F339 Major depressive disorder, recurrent, unspecified: Secondary | ICD-10-CM | POA: Diagnosis not present

## 2023-04-11 DIAGNOSIS — E049 Nontoxic goiter, unspecified: Secondary | ICD-10-CM | POA: Diagnosis not present

## 2023-04-11 DIAGNOSIS — Z0000 Encounter for general adult medical examination without abnormal findings: Secondary | ICD-10-CM | POA: Diagnosis not present

## 2023-04-14 ENCOUNTER — Other Ambulatory Visit (HOSPITAL_COMMUNITY): Payer: Self-pay

## 2023-04-17 ENCOUNTER — Other Ambulatory Visit (HOSPITAL_COMMUNITY): Payer: Self-pay

## 2023-04-18 ENCOUNTER — Telehealth: Payer: Self-pay

## 2023-04-18 ENCOUNTER — Ambulatory Visit: Payer: BC Managed Care – PPO | Admitting: Physician Assistant

## 2023-04-18 ENCOUNTER — Other Ambulatory Visit (HOSPITAL_COMMUNITY): Payer: Self-pay

## 2023-04-18 DIAGNOSIS — L4050 Arthropathic psoriasis, unspecified: Secondary | ICD-10-CM

## 2023-04-18 DIAGNOSIS — Z79899 Other long term (current) drug therapy: Secondary | ICD-10-CM

## 2023-04-18 DIAGNOSIS — M19041 Primary osteoarthritis, right hand: Secondary | ICD-10-CM

## 2023-04-18 DIAGNOSIS — R5383 Other fatigue: Secondary | ICD-10-CM

## 2023-04-18 DIAGNOSIS — E559 Vitamin D deficiency, unspecified: Secondary | ICD-10-CM

## 2023-04-18 DIAGNOSIS — M170 Bilateral primary osteoarthritis of knee: Secondary | ICD-10-CM

## 2023-04-18 DIAGNOSIS — M19071 Primary osteoarthritis, right ankle and foot: Secondary | ICD-10-CM

## 2023-04-18 DIAGNOSIS — S32010A Wedge compression fracture of first lumbar vertebra, initial encounter for closed fracture: Secondary | ICD-10-CM

## 2023-04-18 DIAGNOSIS — L409 Psoriasis, unspecified: Secondary | ICD-10-CM

## 2023-04-18 DIAGNOSIS — M7701 Medial epicondylitis, right elbow: Secondary | ICD-10-CM

## 2023-04-18 DIAGNOSIS — M5136 Other intervertebral disc degeneration, lumbar region: Secondary | ICD-10-CM

## 2023-04-18 DIAGNOSIS — E041 Nontoxic single thyroid nodule: Secondary | ICD-10-CM | POA: Diagnosis not present

## 2023-04-18 DIAGNOSIS — Z8679 Personal history of other diseases of the circulatory system: Secondary | ICD-10-CM

## 2023-04-18 DIAGNOSIS — M7062 Trochanteric bursitis, left hip: Secondary | ICD-10-CM

## 2023-04-18 NOTE — Telephone Encounter (Signed)
Received notification from [facility] that pt has obtained new insurance and will need a new PA. Additionally, claim indicates that "service not appropriate for this location" meaning that pt will be locked [facility].  Submitted a Prior Authorization request to [facility] for HUMIRA via CoverMyMeds. Will update once we receive a response.  Key: [id number]  *NOTE: the PA questionnaire states that the preferred products for which coverage is provided for the treatment of psoriatic arthritis are adalimumab-adaz, Avsola, Cosentyx, Enbrel, Hyrimoz, Otezla, Remicade, Rinvoq, Simponi Aria, Skyrizi [place], [place], and Bovina, however it specifically requests documentation that pt has tried and failed both adalimumab-adaz and Hyrimoz--which the pt has not. Selecting "no" populates no further questions.

## 2023-04-19 NOTE — Telephone Encounter (Signed)
Received a fax regarding Prior Authorization from [facility] for HUMIRA. Authorization has been DENIED because there are other preferred options. For physician-administered - Avsola, Remicade, Simponi Aria. For self-administered - adalimumab-adaz, Hyrimoz, Cimzia PFS, Cosentyx, Enbrel, Otezla, Rinvoq, Skyrizi SQ, Stelara SQ, Tremfya  Will discuss biosimilar switch with patient  Karen Bryant, PharmD, MPH, BCPS, CPP Clinical Pharmacist (Rheumatology and Pulmonology)

## 2023-04-20 ENCOUNTER — Telehealth: Payer: Self-pay | Admitting: Pharmacist

## 2023-04-20 DIAGNOSIS — L409 Psoriasis, unspecified: Secondary | ICD-10-CM

## 2023-04-20 DIAGNOSIS — Z79899 Other long term (current) drug therapy: Secondary | ICD-10-CM

## 2023-04-20 DIAGNOSIS — L4050 Arthropathic psoriasis, unspecified: Secondary | ICD-10-CM

## 2023-04-20 NOTE — Telephone Encounter (Signed)
Received notification from patient's new insurance [facility] that HUMIRA is not the preferred adalimumab product on formulary. Patient's insurance will prefer the following biosimilar(s): HYRIMOZ (Adalimumab-adaz).  Discussed the switch to biosimilar with the patient. Reviewed that biosimilars are as clinically effective, as safe, and no more harmful than the originator product based on FDA studies. They are similar to generics but are different at molecular level and some require physician approval for the switch. Reviewed that biosimilars are FDA-approved and that studies are completed in patients who are clinically established on Humira and switched to biosimiliar. Discussed that dose and frequency are the same as for Humira.  Patient is in agreement to biosimilar switch from Humira to  [facility] Hazel Sams). Pharmacy team will proceed with benefits investigation.  New start visit will not be needed for switch to  HYRIMOZ Hazel Sams). However advised patient that if any issues with biosimilar injector device, patient should contact clinic and we can schedule training visit with pharmacy team.  Patient stated she will set up MyChart account for [place] to send the co-pay card information through.

## 2023-04-20 NOTE — Telephone Encounter (Signed)
Patient required to switch to adalimumab-adaz/Hyrimoz per new [employer]. Submitted an urgent Prior Authorization request to [facility] for  adalimumab-adaz  via CoverMyMeds. Will update once we receive a response.  Key: [id number]  Patient working on activating MyChart    Chesley Mires, PharmD, MPH, BCPS, CPP Clinical Pharmacist (Rheumatology and Pulmonology)

## 2023-04-21 MED ORDER — ADALIMUMAB-ADAZ 40 MG/0.4ML ~~LOC~~ SOAJ
40.0000 mg | SUBCUTANEOUS | 0 refills | Status: DC
Start: 2023-04-21 — End: 2023-08-16

## 2023-04-21 NOTE — Telephone Encounter (Signed)
Received notification from [facility] regarding a prior authorization for  adalimumab-adaz . Authorization has been APPROVED from 04/20/23 to 04/19/24. Approval letter sent to scan center.  Patient must fill through [facility]: [phone]  Authorization # [id number]  Rx for adalimumab-adaz sent to  [facility].  Chesley Mires, PharmD, MPH, BCPS, CPP Clinical Pharmacist (Rheumatology and Pulmonology)

## 2023-04-24 NOTE — Telephone Encounter (Signed)
Called [facility] to change the prescription from brand Hyrimoz to generic adalimumab-adaz. Prescription went through and has $80 copay.  Left VM message with patient informing her that Rx went through and of copay cost. Asked patient to call us back to receive copay card information since patient has not set up MyChart yet. Will mail copay card information to patient as well.

## 2023-04-25 DIAGNOSIS — J449 Chronic obstructive pulmonary disease, unspecified: Secondary | ICD-10-CM | POA: Diagnosis not present

## 2023-04-25 DIAGNOSIS — Z299 Encounter for prophylactic measures, unspecified: Secondary | ICD-10-CM | POA: Diagnosis not present

## 2023-04-25 DIAGNOSIS — I10 Essential (primary) hypertension: Secondary | ICD-10-CM | POA: Diagnosis not present

## 2023-04-25 DIAGNOSIS — D849 Immunodeficiency, unspecified: Secondary | ICD-10-CM | POA: Diagnosis not present

## 2023-04-26 ENCOUNTER — Telehealth: Payer: Self-pay | Admitting: Rheumatology

## 2023-04-26 ENCOUNTER — Telehealth: Payer: Self-pay | Admitting: Physician Assistant

## 2023-04-26 NOTE — Telephone Encounter (Signed)
Left VM attempt #2 to reach patient

## 2023-04-26 NOTE — Telephone Encounter (Signed)
LMOM for patient to call and schedule follow-up appointment.   °

## 2023-04-26 NOTE — Telephone Encounter (Signed)
LVM for pt to call back and schedule an appt.

## 2023-04-26 NOTE — Telephone Encounter (Signed)
-----   Message from Nurse Richardo Priest sent at 04/26/2023 10:36 AM EDT ----- Please schedule patient a follow up visit. Patient due July 2024. Thanks!

## 2023-05-01 NOTE — Telephone Encounter (Signed)
Left VM #3 for patient

## 2023-05-10 ENCOUNTER — Other Ambulatory Visit (HOSPITAL_COMMUNITY): Payer: Self-pay

## 2023-06-15 DIAGNOSIS — I10 Essential (primary) hypertension: Secondary | ICD-10-CM | POA: Diagnosis not present

## 2023-06-15 DIAGNOSIS — Z299 Encounter for prophylactic measures, unspecified: Secondary | ICD-10-CM | POA: Diagnosis not present

## 2023-06-15 DIAGNOSIS — R600 Localized edema: Secondary | ICD-10-CM | POA: Diagnosis not present

## 2023-06-27 ENCOUNTER — Telehealth: Payer: Self-pay | Admitting: *Deleted

## 2023-06-27 NOTE — Telephone Encounter (Signed)
Received a refill request on Adalimumab-adaz pen. Patient is overdue for labs and a follow up appointment. Left message to advise patient she is overdue for a follow up visit and labs.

## 2023-06-29 DIAGNOSIS — E041 Nontoxic single thyroid nodule: Secondary | ICD-10-CM | POA: Diagnosis not present

## 2023-06-29 DIAGNOSIS — J392 Other diseases of pharynx: Secondary | ICD-10-CM | POA: Diagnosis not present

## 2023-07-06 ENCOUNTER — Other Ambulatory Visit: Payer: Self-pay | Admitting: Otolaryngology

## 2023-07-06 DIAGNOSIS — E041 Nontoxic single thyroid nodule: Secondary | ICD-10-CM

## 2023-08-15 NOTE — Progress Notes (Unsigned)
Office Visit Note  Patient: Karen Bryant             Date of Birth: [birth year]           MRN: [id number]             PCP: Kirstie Peri, MD Referring: Kirstie Peri, MD Visit Date: 08/16/2023 Occupation: @GUAROCC @  Subjective:  Pain in multiple joints   History of Present Illness: TRUE Shackleford is a 67 y.o. female with history psoriatic arthritis and osteoarthritis.  Patient is prescribed adalimumab-adaz 40 mg subcutaneous injections every 14 days.  Patient states that she has been out of her prescription for the past 1 month.  She is currently having a flare involving multiple joints.  Her pain is most severe involving the right knee which has progressively been worsening for the past 1 month.  She has a difficulty rising from a seated position due to the discomfort as well as difficulty climbing steps.  She has not been able to perform the activities that she would like to do severity of pain.  She is also having increased discomfort in the left elbow as well as both SI joints.  She denies any flare of psoriasis recently.  Morning: She denies any Achilles tendinitis or plantar fasciitis.  She denies any recent or recurrent infections.   Activities of Daily Living:  Patient reports stiffness lasting all day  Patient Reports nocturnal pain.  Difficulty dressing/grooming: Denies Difficulty climbing stairs: Reports Difficulty getting out of chair: Reports Difficulty using hands for taps, buttons, cutlery, and/or writing: Denies  Review of Systems  Constitutional:  Positive for fatigue.  HENT:  Negative for mouth sores, mouth dryness and nose dryness.   Eyes:  Negative for pain, visual disturbance and dryness.  Respiratory:  Positive for cough. Negative for hemoptysis, shortness of breath and difficulty breathing.   Cardiovascular:  Negative for chest pain, palpitations, hypertension and swelling in legs/feet.  Gastrointestinal:  Negative for blood in stool, constipation and diarrhea.   Endocrine: Negative for increased urination.  Genitourinary:  Negative for painful urination.  Musculoskeletal:  Positive for joint pain, joint pain, joint swelling and morning stiffness. Negative for myalgias, muscle weakness, muscle tenderness and myalgias.  Skin:  Negative for color change, pallor, rash, hair loss, nodules/bumps, skin tightness, ulcers and sensitivity to sunlight.  Allergic/Immunologic: Negative for susceptible to infections.  Neurological:  Negative for dizziness, numbness, headaches and weakness.  Hematological:  Negative for swollen glands.  Psychiatric/Behavioral:  Negative for depressed mood and sleep disturbance. The patient is not nervous/anxious.     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   Essential hypertension 01/20/2017   History of depression 01/20/2017   Psoriatic arthropathy (HCC) 01/16/2017   Psoriasis 01/16/2017   Primary osteoarthritis of both feet 01/16/2017   Primary osteoarthritis of both hands 01/16/2017   Primary osteoarthritis of both knees 01/16/2017    Past Medical History:  Diagnosis Date   Psoriatic arthritis (HCC)     Family History  Problem Relation Age of Onset   Alzheimer's disease Mother    Heart failure Mother    Parkinson's disease Father    Cancer Father        prostate   Cancer Sister        breast cancer    Healthy Daughter    Healthy Daughter    Past Surgical History:  Procedure Laterality Date  CHOLECYSTECTOMY  03/10/2021   TUBAL LIGATION     Social History   Social History Narrative   Not on file   Immunization History  Administered Date(s) Administered   Moderna Sars-Covid-2 Vaccination 01/09/2020, 02/08/2020     Objective: Vital Signs: BP 124/84 (BP Location: Left Arm, Patient Position: Sitting, Cuff Size: Normal)   Pulse 73   Resp 16   Ht 5\' 5"  (1.651 m)   Wt 235 lb (106.6 kg)   BMI 39.11 kg/m     Physical Exam Vitals and nursing note reviewed.  Constitutional:      Appearance: She is well-developed.  HENT:     Head: Normocephalic and atraumatic.  Eyes:     Conjunctiva/sclera: Conjunctivae normal.  Cardiovascular:     Rate and Rhythm: Normal rate and regular rhythm.     Heart sounds: Normal heart sounds.  Pulmonary:     Effort: Pulmonary effort is normal.     Breath sounds: Normal breath sounds.  Abdominal:     General: Bowel sounds are normal.     Palpations: Abdomen is soft.  Musculoskeletal:     Cervical back: Normal range of motion.  Lymphadenopathy:     Cervical: No cervical adenopathy.  Skin:    General: Skin is warm and dry.     Capillary Refill: Capillary refill takes less than 2 seconds.  Neurological:     Mental Status: She is alert and oriented to person, place, and time.  Psychiatric:        Behavior: Behavior normal.      Musculoskeletal Exam: C-spine, thoracic spine, lumbar spine have good range of motion.  Tenderness over both SI joints.  No midline spinal tenderness.  Shoulder joints, elbow joints, wrist joints, MCPs, PIPs, DIPs have good range of motion with no synovitis.  Tenderness along the left elbow joint line.  PIP and DIP thickening.  Complete fist formation bilaterally.  Hip joints have good range of motion with no groin pain.  Painful range of motion of the right knee with warmth.  Ankle joints have good range of motion with no joint tenderness or synovitis.  Pedal edema noted bilaterally.  No evidence of Achilles tendinitis or plantar fasciitis.  CDAI Exam: CDAI Score: -- Patient Global: --; Provider Global: -- Swollen: 0 ; Tender: 4  Joint Exam 08/16/2023      Right  Left  Elbow      Tender  Sacroiliac   Tender   Tender  Knee   Tender        Investigation: No additional findings.  Imaging: No results found.  Recent Labs: Lab Results  Component Value Date   WBC 6.7 02/17/2023   HGB 14.1 02/17/2023   PLT 203 02/17/2023    NA 139 02/17/2023   K 4.4 02/17/2023   CL 105 02/17/2023   CO2 24 02/17/2023   GLUCOSE 112 (H) 02/17/2023   BUN 14 02/17/2023   CREATININE 0.74 02/17/2023   BILITOT 0.9 02/17/2023   ALKPHOS 62 06/13/2017   AST 15 02/17/2023   ALT 16 02/17/2023   PROT 6.3 02/17/2023   ALBUMIN 4.1 06/13/2017   CALCIUM 8.9 02/17/2023   GFRAA 109 11/17/2020   QFTBGOLDPLUS NEGATIVE 06/07/2022    Speciality Comments: No specialty comments available.  Procedures:  Large Joint Inj: R knee on 08/16/2023 8:55 AM Indications: pain Details: 27 G 1.5 in needle, medial approach  Arthrogram: No  Medications: 1.5 mL lidocaine 1 %; 40 mg triamcinolone acetonide 40 MG/ML Aspirate: 0 mL  Outcome: tolerated well, no immediate complications Procedure, treatment alternatives, risks and benefits explained, specific risks discussed. Consent was given by the patient. Immediately prior to procedure a time out was called to verify the correct patient, procedure, equipment, support staff and site/side marked as required. Patient was prepped and draped in the usual sterile fashion.     Allergies: Patient has no known allergies.   Assessment / Plan:     Visit Diagnoses: Psoriatic arthropathy [facility]): Patient presents today experiencing a flare involving multiple joints.  Her pain is most severe involving the right knee.  She has limited extension, warmth, and discomfort when rising from a seated position.  Her symptoms have progressively been worsening over the past 1 month.  She is also having increased discomfort in the left elbow and both SI joints.  She has been off of adalimumab-adaz for over 1 month due to requiring updated lab work and a follow-up visit.  Prior to having a gap in therapy her symptoms were well-controlled.  She has no active psoriasis at this time.  Patient has had difficulty performing ADLs due to severity of her symptoms.  Different treatment options were discussed today.  X-rays of the right knee were  updated and after informed consent the right knee joint was injected with cortisone.  The patient also requested a prednisone taper to alleviate her current flare.  A prednisone taper starting at 20 mg tapering by 5 mg every 4 days was sent to the pharmacy.  She was advised to notify us if her symptoms persist or worsen.  A refill of adalimumab-adaz will be sent to the pharmacy. She will follow up in 3 months or sooner if needed.    Psoriasis: No active psoriasis at this time.  High risk medication use - Adalimumab-adaz 40 mg sq injections once every 14 days. Previously spaced humira due to low WBC count in November 2022. CBC and CMP were drawn on 02/17/2023.  Orders for CBC and CMP were released today. TB Gold negative on 06/07/22  Discussed the importance of holding Humira if she develops signs or symptoms of an infection and to resume once the infection has completely cleared.  - Plan: CBC with Differential/Platelet, COMPLETE METABOLIC PANEL WITH GFR, QuantiFERON-TB Gold Plus  Screening for tuberculosis -TB gold order released today.  Plan: QuantiFERON-TB Gold Plus  Primary osteoarthritis of both hands: PIP and DIP thickening.  No synovitis or dactylitis noted.  Complete fist formation bilaterally.  Chronic pain of right knee -Patient presents today with right knee joint pain.  No recent injury or fall.  Her symptoms have progressively been worsening for the past 1 month.  She has been out of her prescription for adalimumab-adaz for over 1 month.  She has limited extension with warmth on examination today.  She has had difficulty rising from a seated position, climbing steps, and walking prolonged distances.  X-rays of the right knee were updated today for further evaluation.  The right knee joint was injected with cortisone.  She tolerated procedure well.  Aftercare was discussed.  She was advised to notify us if her symptoms persist or worsen.  Plan: XR KNEE 3 VIEW RIGHT  Primary osteoarthritis of  both knees - X-rays of the right knee were updated on 06/01/2021 which were consistent with moderate osteoarthritis.  Slightly limited extension of the right knee.  Patient presents today with a flare involving the right knee.  She has painful range of motion with warmth on examination today.  X-rays of  the right knee were updated today.  After informed consent the right knee was injected with cortisone.  She tolerated the procedure well.  Procedure note was completed above.  Aftercare was discussed.  She was advised to notify us if her symptoms persist or worsen.  Primary osteoarthritis of both feet: She has good ROM of both ankles with no tenderness or joint swelling.   Degeneration of intervertebral disc of lumbar region without discogenic back pain or lower extremity pain: No midline spinal tenderness.  No symptoms of radiculopathy.   Medial epicondylitis of elbow, right: Not currently symptomatic.   Trochanteric bursitis, left hip: Not currently symptomatic   Closed compression fracture of body of L1 vertebra (HCC) - 03/28/2022: Degenerative joint changes of the lower thoracic spine and lumbar spine.  20% chronic compression deformity of L1.  Vitamin D deficiency: She is taking a daily calcium and vitamin D supplement daily.   History of hypertension: BP was 124/84 today in the office   Other fatigue  Orders: Orders Placed This Encounter  Procedures   Large Joint Inj   XR KNEE 3 VIEW RIGHT   CBC with Differential/Platelet   COMPLETE METABOLIC PANEL WITH GFR   QuantiFERON-TB Gold Plus   Meds ordered this encounter  Medications   predniSONE (DELTASONE) 5 MG tablet    Sig: Take 4 tablets by mouth daily x4 days, 3 tablets daily x4 days, 2 tablets daily x4 days, 1 tablet daily x4 days.    Dispense:  40 tablet    Refill:  0   Follow-Up Instructions: Return in about 3 months (around 11/16/2023) for Psoriatic arthritis, Osteoarthritis.   Gearldine Bienenstock, PA-C  Note - This record has  been created using Dragon software.  Chart creation errors have been sought, but may not always  have been located. Such creation errors do not reflect on  the standard of medical care.

## 2023-08-16 ENCOUNTER — Encounter: Payer: Self-pay | Admitting: Physician Assistant

## 2023-08-16 ENCOUNTER — Other Ambulatory Visit: Payer: Self-pay

## 2023-08-16 ENCOUNTER — Ambulatory Visit: Payer: No Typology Code available for payment source | Attending: Physician Assistant | Admitting: Physician Assistant

## 2023-08-16 ENCOUNTER — Ambulatory Visit: Payer: No Typology Code available for payment source

## 2023-08-16 VITALS — BP 124/84 | HR 73 | Resp 16 | Ht 65.0 in | Wt 235.0 lb

## 2023-08-16 DIAGNOSIS — L409 Psoriasis, unspecified: Secondary | ICD-10-CM | POA: Diagnosis not present

## 2023-08-16 DIAGNOSIS — Z79899 Other long term (current) drug therapy: Secondary | ICD-10-CM | POA: Diagnosis not present

## 2023-08-16 DIAGNOSIS — M51369 Other intervertebral disc degeneration, lumbar region without mention of lumbar back pain or lower extremity pain: Secondary | ICD-10-CM

## 2023-08-16 DIAGNOSIS — L4050 Arthropathic psoriasis, unspecified: Secondary | ICD-10-CM

## 2023-08-16 DIAGNOSIS — M7062 Trochanteric bursitis, left hip: Secondary | ICD-10-CM | POA: Diagnosis not present

## 2023-08-16 DIAGNOSIS — Z8679 Personal history of other diseases of the circulatory system: Secondary | ICD-10-CM | POA: Diagnosis not present

## 2023-08-16 DIAGNOSIS — M19041 Primary osteoarthritis, right hand: Secondary | ICD-10-CM

## 2023-08-16 DIAGNOSIS — M19072 Primary osteoarthritis, left ankle and foot: Secondary | ICD-10-CM

## 2023-08-16 DIAGNOSIS — R5383 Other fatigue: Secondary | ICD-10-CM

## 2023-08-16 DIAGNOSIS — M7701 Medial epicondylitis, right elbow: Secondary | ICD-10-CM

## 2023-08-16 DIAGNOSIS — M19042 Primary osteoarthritis, left hand: Secondary | ICD-10-CM

## 2023-08-16 DIAGNOSIS — M170 Bilateral primary osteoarthritis of knee: Secondary | ICD-10-CM | POA: Diagnosis not present

## 2023-08-16 DIAGNOSIS — S32010A Wedge compression fracture of first lumbar vertebra, initial encounter for closed fracture: Secondary | ICD-10-CM | POA: Diagnosis not present

## 2023-08-16 DIAGNOSIS — Z111 Encounter for screening for respiratory tuberculosis: Secondary | ICD-10-CM | POA: Diagnosis not present

## 2023-08-16 DIAGNOSIS — M25561 Pain in right knee: Secondary | ICD-10-CM

## 2023-08-16 DIAGNOSIS — E559 Vitamin D deficiency, unspecified: Secondary | ICD-10-CM

## 2023-08-16 DIAGNOSIS — M19071 Primary osteoarthritis, right ankle and foot: Secondary | ICD-10-CM | POA: Diagnosis not present

## 2023-08-16 DIAGNOSIS — G8929 Other chronic pain: Secondary | ICD-10-CM | POA: Diagnosis not present

## 2023-08-16 MED ORDER — LIDOCAINE HCL 1 % IJ SOLN
1.5000 mL | INTRAMUSCULAR | Status: AC | PRN
  Administered 2023-08-16: 1.5 mL

## 2023-08-16 MED ORDER — ADALIMUMAB-ADAZ 40 MG/0.4ML ~~LOC~~ SOAJ: 40.0000 mg | SUBCUTANEOUS | 0 refills | Status: DC

## 2023-08-16 MED ORDER — TRIAMCINOLONE ACETONIDE 40 MG/ML IJ SUSP
40.0000 mg | INTRAMUSCULAR | Status: AC | PRN
  Administered 2023-08-16: 40 mg via INTRA_ARTICULAR

## 2023-08-16 MED ORDER — PREDNISONE 5 MG PO TABS: ORAL_TABLET | ORAL | 0 refills | Status: DC

## 2023-08-16 NOTE — Telephone Encounter (Signed)
Please review and sign pended humira refill. Thanks!

## 2023-08-16 NOTE — Patient Instructions (Signed)

## 2023-08-16 NOTE — Progress Notes (Signed)
CBC WNL

## 2023-08-17 NOTE — Progress Notes (Signed)
Glucose is 126. Rest of CMP WNL.

## 2023-08-18 LAB — CBC WITH DIFFERENTIAL/PLATELET
Absolute Lymphocytes: 1795 {cells}/uL (ref 850–3900)
Absolute Monocytes: 370 {cells}/uL (ref 200–950)
Basophils Absolute: 62 {cells}/uL (ref 0–200)
Basophils Relative: 1.4 %
Eosinophils Absolute: 40 {cells}/uL (ref 15–500)
Eosinophils Relative: 0.9 %
HCT: 43.3 % (ref 35.0–45.0)
Hemoglobin: 14.1 g/dL (ref 11.7–15.5)
MCH: 28.1 pg (ref 27.0–33.0)
MCHC: 32.6 g/dL (ref 32.0–36.0)
MCV: 86.4 fL (ref 80.0–100.0)
MPV: 11 fL (ref 7.5–12.5)
Monocytes Relative: 8.4 %
Neutro Abs: 2134 {cells}/uL (ref 1500–7800)
Neutrophils Relative %: 48.5 %
Platelets: 244 10*3/uL (ref 140–400)
RBC: 5.01 10*6/uL (ref 3.80–5.10)
RDW: 13.4 % (ref 11.0–15.0)
Total Lymphocyte: 40.8 %
WBC: 4.4 10*3/uL (ref 3.8–10.8)

## 2023-08-18 LAB — COMPLETE METABOLIC PANEL WITH GFR
AG Ratio: 1.6 (calc) (ref 1.0–2.5)
ALT: 21 U/L (ref 6–29)
AST: 18 U/L (ref 10–35)
Albumin: 4.1 g/dL (ref 3.6–5.1)
Alkaline phosphatase (APISO): 79 U/L (ref 37–153)
BUN: 16 mg/dL (ref 7–25)
CO2: 25 mmol/L (ref 20–32)
Calcium: 8.7 mg/dL (ref 8.6–10.4)
Chloride: 106 mmol/L (ref 98–110)
Creat: 0.68 mg/dL (ref 0.50–1.05)
Globulin: 2.5 g/dL (ref 1.9–3.7)
Glucose, Bld: 126 mg/dL — ABNORMAL HIGH (ref 65–99)
Potassium: 4.4 mmol/L (ref 3.5–5.3)
Sodium: 140 mmol/L (ref 135–146)
Total Bilirubin: 0.7 mg/dL (ref 0.2–1.2)
Total Protein: 6.6 g/dL (ref 6.1–8.1)
eGFR: 96 mL/min/{1.73_m2} (ref >=60)

## 2023-08-18 LAB — QUANTIFERON-TB GOLD PLUS
Mitogen-NIL: >10 [IU]/mL
NIL: 0.04 [IU]/mL
QuantiFERON-TB Gold Plus: NEGATIVE
TB1-NIL: 0 [IU]/mL
TB2-NIL: 0 [IU]/mL

## 2023-08-20 NOTE — Progress Notes (Signed)
TB gold negative

## 2023-08-30 ENCOUNTER — Inpatient Hospital Stay: Admission: RE | Admit: 2023-08-30 | Payer: Self-pay | Source: Ambulatory Visit

## 2023-09-27 DIAGNOSIS — R300 Dysuria: Secondary | ICD-10-CM | POA: Diagnosis not present

## 2023-09-27 DIAGNOSIS — N390 Urinary tract infection, site not specified: Secondary | ICD-10-CM | POA: Diagnosis not present

## 2023-10-18 DIAGNOSIS — J449 Chronic obstructive pulmonary disease, unspecified: Secondary | ICD-10-CM | POA: Diagnosis not present

## 2023-10-18 DIAGNOSIS — D849 Immunodeficiency, unspecified: Secondary | ICD-10-CM | POA: Diagnosis not present

## 2023-10-18 DIAGNOSIS — J069 Acute upper respiratory infection, unspecified: Secondary | ICD-10-CM | POA: Diagnosis not present

## 2023-10-18 DIAGNOSIS — R0981 Nasal congestion: Secondary | ICD-10-CM | POA: Diagnosis not present

## 2023-10-25 ENCOUNTER — Inpatient Hospital Stay: Admission: RE | Admit: 2023-10-25 | Payer: Self-pay | Source: Ambulatory Visit

## 2023-10-31 DIAGNOSIS — J449 Chronic obstructive pulmonary disease, unspecified: Secondary | ICD-10-CM | POA: Diagnosis not present

## 2023-10-31 DIAGNOSIS — F339 Major depressive disorder, recurrent, unspecified: Secondary | ICD-10-CM | POA: Diagnosis not present

## 2023-10-31 DIAGNOSIS — I10 Essential (primary) hypertension: Secondary | ICD-10-CM | POA: Diagnosis not present

## 2023-10-31 DIAGNOSIS — K219 Gastro-esophageal reflux disease without esophagitis: Secondary | ICD-10-CM | POA: Diagnosis not present

## 2023-10-31 DIAGNOSIS — M8580 Other specified disorders of bone density and structure, unspecified site: Secondary | ICD-10-CM | POA: Diagnosis not present

## 2023-10-31 DIAGNOSIS — Z299 Encounter for prophylactic measures, unspecified: Secondary | ICD-10-CM | POA: Diagnosis not present

## 2023-11-03 DIAGNOSIS — J069 Acute upper respiratory infection, unspecified: Secondary | ICD-10-CM | POA: Diagnosis not present

## 2023-11-03 DIAGNOSIS — I10 Essential (primary) hypertension: Secondary | ICD-10-CM | POA: Diagnosis not present

## 2023-11-03 DIAGNOSIS — Z299 Encounter for prophylactic measures, unspecified: Secondary | ICD-10-CM | POA: Diagnosis not present

## 2023-11-03 DIAGNOSIS — L4050 Arthropathic psoriasis, unspecified: Secondary | ICD-10-CM | POA: Diagnosis not present

## 2023-11-07 NOTE — Progress Notes (Deleted)
 Office Visit Note  Patient: Karen Bryant             Date of Birth: Jan 10, 1956           MRN: [id number]             PCP: Kirstie Peri, MD Referring: Kirstie Peri, MD Visit Date: 11/21/2023 Occupation: @GUAROCC @  Subjective:    History of Present Illness: Karen Bryant is a 68 y.o. female with history of psoriatic arthritis and osteoarthritis.  Patient remains on  Adalimumab-adaz 40 mg sq injections once every 14 days.   TB gold negative on 08/16/23.  CBC and CMP 08/16/23.  Orders for CBC and CMP released today.   Discussed the importance of holding Adalimumab-adaz if she develops signs or symptoms of an infection and to resume once the infection has completely cleared.    Activities of Daily Living:  Patient reports morning stiffness for *** {minute/hour:19697}.   Patient {ACTIONS;DENIES/REPORTS:21021675::"Denies"} nocturnal pain.  Difficulty dressing/grooming: {ACTIONS;DENIES/REPORTS:21021675::"Denies"} Difficulty climbing stairs: {ACTIONS;DENIES/REPORTS:21021675::"Denies"} Difficulty getting out of chair: {ACTIONS;DENIES/REPORTS:21021675::"Denies"} Difficulty using hands for taps, buttons, cutlery, and/or writing: {ACTIONS;DENIES/REPORTS:21021675::"Denies"}  No Rheumatology ROS completed.   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   Essential hypertension 01/20/2017   History of depression 01/20/2017   Psoriatic arthropathy (HCC) 01/16/2017   Psoriasis 01/16/2017   Primary osteoarthritis of both feet 01/16/2017   Primary osteoarthritis of both hands 01/16/2017   Primary osteoarthritis of both knees 01/16/2017    Past Medical History:  Diagnosis Date   Psoriatic arthritis (HCC)     Family History  Problem Relation Age of Onset   Alzheimer's disease Mother    Heart failure Mother    Parkinson's disease Father    Cancer Father        prostate   Cancer Sister         breast cancer    Healthy Daughter    Healthy Daughter    Past Surgical History:  Procedure Laterality Date   CHOLECYSTECTOMY  03/10/2021   TUBAL LIGATION     Social History   Social History Narrative   Not on file   Immunization History  Administered Date(s) Administered   Moderna Sars-Covid-2 Vaccination 01/09/2020, 02/08/2020     Objective: Vital Signs: There were no vitals taken for this visit.   Physical Exam Vitals and nursing note reviewed.  Constitutional:      Appearance: She is well-developed.  HENT:     Head: Normocephalic and atraumatic.  Eyes:     Conjunctiva/sclera: Conjunctivae normal.  Cardiovascular:     Rate and Rhythm: Normal rate and regular rhythm.     Heart sounds: Normal heart sounds.  Pulmonary:     Effort: Pulmonary effort is normal.     Breath sounds: Normal breath sounds.  Abdominal:     General: Bowel sounds are normal.     Palpations: Abdomen is soft.  Musculoskeletal:     Cervical back: Normal range of motion.  Lymphadenopathy:     Cervical: No cervical adenopathy.  Skin:    General: Skin is warm and dry.     Capillary Refill: Capillary refill takes less than 2 seconds.  Neurological:     Mental Status: She is alert and oriented to person, place, and time.  Psychiatric:        Behavior: Behavior normal.      Musculoskeletal Exam: ***  CDAI Exam: CDAI  Score: -- Patient Global: --; Provider Global: -- Swollen: --; Tender: -- Joint Exam 11/21/2023   No joint exam has been documented for this visit   There is currently no information documented on the homunculus. Go to the Rheumatology activity and complete the homunculus joint exam.  Investigation: No additional findings.  Imaging: No results found.  Recent Labs: Lab Results  Component Value Date   WBC 4.4 08/16/2023   HGB 14.1 08/16/2023   PLT 244 08/16/2023   NA 140 08/16/2023   K 4.4 08/16/2023   CL 106 08/16/2023   CO2 25 08/16/2023   GLUCOSE 126 (H)  08/16/2023   BUN 16 08/16/2023   CREATININE 0.68 08/16/2023   BILITOT 0.7 08/16/2023   ALKPHOS 62 06/13/2017   AST 18 08/16/2023   ALT 21 08/16/2023   PROT 6.6 08/16/2023   ALBUMIN 4.1 06/13/2017   CALCIUM 8.7 08/16/2023   GFRAA 109 11/17/2020   QFTBGOLDPLUS NEGATIVE 08/16/2023    Speciality Comments: No specialty comments available.  Procedures:  No procedures performed Allergies: Patient has no known allergies.   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  No orders of the defined types were placed in this encounter.   Face-to-face time spent with patient was *** minutes. Greater than 50% of time was spent in counseling and coordination of care.  Follow-Up Instructions: No follow-ups on file.   Gearldine Bienenstock, PA-C  Note - This record has been created using Dragon software.  Chart creation errors have been sought, but may not always  have been located. Such creation errors do not reflect on  the standard of medical care.

## 2023-11-21 ENCOUNTER — Ambulatory Visit: Payer: No Typology Code available for payment source | Admitting: Physician Assistant

## 2023-11-21 DIAGNOSIS — M51369 Other intervertebral disc degeneration, lumbar region without mention of lumbar back pain or lower extremity pain: Secondary | ICD-10-CM

## 2023-11-21 DIAGNOSIS — Z79899 Other long term (current) drug therapy: Secondary | ICD-10-CM

## 2023-11-21 DIAGNOSIS — E559 Vitamin D deficiency, unspecified: Secondary | ICD-10-CM

## 2023-11-21 DIAGNOSIS — S32010A Wedge compression fracture of first lumbar vertebra, initial encounter for closed fracture: Secondary | ICD-10-CM

## 2023-11-21 DIAGNOSIS — M19071 Primary osteoarthritis, right ankle and foot: Secondary | ICD-10-CM

## 2023-11-21 DIAGNOSIS — L4050 Arthropathic psoriasis, unspecified: Secondary | ICD-10-CM

## 2023-11-21 DIAGNOSIS — L409 Psoriasis, unspecified: Secondary | ICD-10-CM

## 2023-11-21 DIAGNOSIS — R5383 Other fatigue: Secondary | ICD-10-CM

## 2023-11-21 DIAGNOSIS — M170 Bilateral primary osteoarthritis of knee: Secondary | ICD-10-CM

## 2023-11-21 DIAGNOSIS — M7701 Medial epicondylitis, right elbow: Secondary | ICD-10-CM

## 2023-11-21 DIAGNOSIS — M7062 Trochanteric bursitis, left hip: Secondary | ICD-10-CM

## 2023-11-21 DIAGNOSIS — Z8679 Personal history of other diseases of the circulatory system: Secondary | ICD-10-CM

## 2023-11-21 DIAGNOSIS — M19041 Primary osteoarthritis, right hand: Secondary | ICD-10-CM

## 2023-11-28 DIAGNOSIS — Z1231 Encounter for screening mammogram for malignant neoplasm of breast: Secondary | ICD-10-CM | POA: Diagnosis not present

## 2024-01-12 NOTE — Progress Notes (Unsigned)
 Office Visit Note  Patient: Karen Bryant             Date of Birth: [birth year]           MRN: [id number]             PCP: Theoplis Fix, MD Referring: Theoplis Fix, MD Visit Date: 01/22/2024 Occupation: @GUAROCC @  Subjective:  Flare   History of Present Illness: Karen Bryant is a 68 y.o. female with history of psoriatic arthritis and osteoarthritis. Patient remains on  Adalimumab-adaz 40 mg sq injections once every 14 days.  Her most recent injection was administered yesterday.  She presents today to discuss treatment options.  She has been experiencing more frequent flares on the current biosimilar.  She states the psoriasis on her scalp has returned and she is having increased pain in both knees.  She states the right knee has been swollen for the past several weeks.  She has been taking aleve for pain relief.    Activities of Daily Living:  Patient reports morning stiffness for 30 minutes.   Patient Reports nocturnal pain.  Difficulty dressing/grooming: Denies Difficulty climbing stairs: Reports Difficulty getting out of chair: Reports Difficulty using hands for taps, buttons, cutlery, and/or writing: Denies  Review of Systems  Constitutional:  Negative for fatigue.  HENT:  Negative for mouth sores, mouth dryness and nose dryness.   Eyes:  Negative for pain and dryness.  Respiratory:  Negative for shortness of breath and difficulty breathing.   Cardiovascular:  Negative for chest pain and palpitations.  Gastrointestinal:  Negative for blood in stool, constipation and diarrhea.  Endocrine: Negative for increased urination.  Genitourinary:  Negative for involuntary urination.  Musculoskeletal:  Positive for joint pain, gait problem, joint pain, joint swelling, myalgias, muscle weakness, morning stiffness, muscle tenderness and myalgias.  Skin:  Positive for rash and hair loss. Negative for color change and sensitivity to sunlight.  Allergic/Immunologic: Negative for susceptible  to infections.  Neurological:  Positive for headaches. Negative for dizziness.  Hematological:  Negative for swollen glands.  Psychiatric/Behavioral:  Negative for depressed mood and sleep disturbance. The patient is not nervous/anxious.     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   Essential hypertension 01/20/2017   History of depression 01/20/2017   Psoriatic arthropathy (HCC) 01/16/2017   Psoriasis 01/16/2017   Primary osteoarthritis of both feet 01/16/2017   Primary osteoarthritis of both hands 01/16/2017   Primary osteoarthritis of both knees 01/16/2017    Past Medical History:  Diagnosis Date   Psoriatic arthritis (HCC)     Family History  Problem Relation Age of Onset   Alzheimer's disease Mother    Heart failure Mother    Parkinson's disease Father    Cancer Father        prostate   Cancer Sister        breast cancer    Healthy Daughter    Healthy Daughter    Past Surgical History:  Procedure Laterality Date   CHOLECYSTECTOMY  03/10/2021   TUBAL LIGATION     Social History   Social History Narrative   Not on file   Immunization History  Administered Date(s) Administered   Moderna Sars-Covid-2 Vaccination 01/09/2020, 02/08/2020     Objective: Vital Signs: BP (!) 140/90 (BP Location: Left Arm, Patient Position: Sitting, Cuff Size: Normal)   Pulse 80   Resp 16  Ht 5\' 5"  (1.651 m)   Wt 245 lb 3.2 oz (111.2 kg)   BMI 40.80 kg/m    Physical Exam Vitals and nursing note reviewed.  Constitutional:      Appearance: She is well-developed.  HENT:     Head: Normocephalic and atraumatic.  Eyes:     Conjunctiva/sclera: Conjunctivae normal.  Cardiovascular:     Rate and Rhythm: Normal rate and regular rhythm.     Heart sounds: Normal heart sounds.  Pulmonary:     Effort: Pulmonary effort is normal.     Breath sounds: Normal breath sounds.  Abdominal:      General: Bowel sounds are normal.     Palpations: Abdomen is soft.  Musculoskeletal:     Cervical back: Normal range of motion.  Lymphadenopathy:     Cervical: No cervical adenopathy.  Skin:    General: Skin is warm and dry.     Capillary Refill: Capillary refill takes less than 2 seconds.  Neurological:     Mental Status: She is alert and oriented to person, place, and time.  Psychiatric:        Behavior: Behavior normal.      Musculoskeletal Exam: C-spine, thoracic spine, lumbar spine good range of motion.  Shoulder joints, elbow joints, wrist joints MCPs, PIPs, DIPs have good range of motion with no synovitis.  PIP and DIP thickening noted.  Hip joints have good range of motion.  Discomfort and limited extension of the right knee with warmth.  Left knee has good range of motion with no warmth or effusion.  Pedal edema noted in bilateral lower extremities.  No evidence of Achilles tendinitis.  CDAI Exam: CDAI Score: -- Patient Global: --; Provider Global: -- Swollen: --; Tender: -- Joint Exam 01/22/2024   No joint exam has been documented for this visit   There is currently no information documented on the homunculus. Go to the Rheumatology activity and complete the homunculus joint exam.  Investigation: No additional findings.  Imaging: No results found.  Recent Labs: Lab Results  Component Value Date   WBC 4.4 08/16/2023   HGB 14.1 08/16/2023   PLT 244 08/16/2023   NA 140 08/16/2023   K 4.4 08/16/2023   CL 106 08/16/2023   CO2 25 08/16/2023   GLUCOSE 126 (H) 08/16/2023   BUN 16 08/16/2023   CREATININE 0.68 08/16/2023   BILITOT 0.7 08/16/2023   ALKPHOS 62 06/13/2017   AST 18 08/16/2023   ALT 21 08/16/2023   PROT 6.6 08/16/2023   ALBUMIN 4.1 06/13/2017   CALCIUM 8.7 08/16/2023   GFRAA 109 11/17/2020   QFTBGOLDPLUS NEGATIVE 08/16/2023    Speciality Comments: No specialty comments available.  Procedures:  Large Joint Inj: R knee on 01/22/2024 9:08  AM Indications: pain Details: 27 G 1.5 in needle, medial approach  Arthrogram: No  Medications: 1.5 mL lidocaine 1 %; 40 mg triamcinolone acetonide 40 MG/ML Aspirate: 0 mL Outcome: tolerated well, no immediate complications Procedure, treatment alternatives, risks and benefits explained, specific risks discussed. Consent was given by the patient. Immediately prior to procedure a time out was called to verify the correct patient, procedure, equipment, support staff and site/side marked as required. Patient was prepped and draped in the usual sterile fashion.     Allergies: Patient has no known allergies.     Assessment / Plan:     Visit Diagnoses: Psoriatic arthropathy [facility]): Patient presents today with increased joint pain, joint stiffness, and a flare of psoriasis on her scalp.  Patient has not found the biosimilar for adalimumab to be as effective as Humira was previously.  She has been experiencing more frequent breakthrough symptoms and has been having to take Aleve for symptomatic relief.  She presents today with recurrence of pain and swelling involving the right knee.  A right knee joint cortisone injection was performed today.  Procedure note was completed above.  A prescription for clobetasol external solution was also sent to the pharmacy to treat her current flare of scalp psoriasis.  Patient requested to discuss treatment alternatives.  Patient has previously tried Humira, Enbrel, and methotrexate.  Reviewed indications, contraindications, potential side effects of Toltz today in detail.  All questions were addressed and consent was obtained.  Patient will be switching to Medicare starting in May 2025 at which time we will initiate the prior Auth for Taltz.  She is eligible to initiate Taltz on or after 02/05/24.  She will follow-up in 8 weeks after initiating Taltz to assess her response.  Psoriasis: Scalp-Patient presents today with a flare of psoriasis on her scalp.  She has not found  the biosimilar for Humira to be as effective as Humira once was.  A prescription for clobetasol external solution will be sent to the pharmacy.  She will also be switching to Delford Felling if approved by insurance.  Medication counseling:  Baseline Immunosuppressant Therapy Labs TB GOLD    Latest Ref Rng & Units 08/16/2023    8:55 AM  Quantiferon TB Gold  Quantiferon TB Gold Plus NEGATIVE NEGATIVE      SPEP    Latest Ref Rng & Units 08/16/2023    8:55 AM  Serum Protein Electrophoresis  Total Protein 6.1 - 8.1 g/dL 6.6      Chest Xray: No active cardiopulmonary disease on 04/16/14  Does patient have a history of inflammatory bowel disease? No  Counseled patient that Delford Felling is a IL-17 inhibitor that works to reduce pain and inflammation associated with arthritis.  Counseled patient on purpose, proper use, and adverse effects of Taltz. Reviewed the most common adverse effects of infection, inflammatory bowel disease, and allergic reaction. Counseled patient that Delford Felling should be held for infection and prior to scheduled surgery.  Counseled patient to avoid live vaccines while on Taltz.  Advised patient to get annual influenza vaccine, pneumococcal vaccine, and Shingrix as indicated.  Reviewed storage information for Taltz.  Reviewed the importance of regular labs while on Taltz. Standing orders placed and is to return in 1 month and then every 3 months after initiation.  Provided patient with medication education material and answered all questions.  Patient consented to Taltz.  Will upload consent into patient's chart.  Will apply for Taltz through patient's insurance and update when we receive a response.  Advised initial injection must be administered in office.  Patient voiced understanding.    Taltz dose will be: For psoriatic arthritis and plaque psoriasis overlap load of 160 mg then 80 mg on weeks 2,4,6,8,10,12 then 80 mg every 28 days  Prescription will be sent to pharmacy pending lab results  and insurance approval.  High risk medication use - Plan to apply for Taltz 80 mg sq injections every 28 days.   Inadequate response to Adalimumab-adaz 40 mg sq injections once every 14 days. Previously spaced humira due to low WBC count in November 2022.  Previous therapy: Enbrel, Humira  CBC and CMP updated on 08/16/23. Orders for CBC and CMP released today.  Her next lab work will be due in 1  month then every 3 months.  TB gold negative on 08/16/23.  04/16/14: SPEP normal pattern, hepatitis panel negative, HIV negative, IgM 36, IgA and IgG WNL,  No recent or recurrent infections. Discussed the importance of holding taltz if she develops signs or symptoms of an infection and to resume once the infection has completely cleared. - Plan: CBC with Differential/Platelet, Comprehensive metabolic panel with GFR, CBC with Differential/Platelet, Comprehensive metabolic panel with GFR  Primary osteoarthritis of both hands: She has PIP and DIP thickening consistent with osteoarthritis of both hands.  Chronic pain of right knee -Patient presents today with a recurrence of pain and swelling involving the right knee.  No recent injury.  No mechanical symptoms.  She had a right knee joint cortisone injection performed on 08/16/2023 which bided significant relief but her symptoms have returned.  Patient requested a repeat right knee joint cortisone injection today.  She tolerated the procedure well.  Procedure note was completed above.  Aftercare was discussed.  Plan: Large Joint Inj: R knee  Primary osteoarthritis of both knees - X-rays of the right knee were updated on 06/01/2021 which were consistent with moderate osteoarthritis.  She had a right knee joint cortisone injection performed on 08/16/2023.  She presents today with a recurrence of pain.  No instability at this time.  Warmth and swelling of the right knee noted.  She requested a repeat right knee joint cortisone injection today.  She tolerated the procedure  well.  Procedure note was completed above.  Aftercare was discussed.  Primary osteoarthritis of both feet: She is not experiencing any increased discomfort in her feet at this time.  She is pedal edema noted bilaterally, left greater than right.  No evidence of Achilles tendinitis.  She is wearing proper fitting shoes.  Degeneration of intervertebral disc of lumbar region without discogenic back pain or lower extremity pain: No symptoms of radiculopathy at this time.  Medial epicondylitis of elbow, right: Not currently symptomatic.  Trochanteric bursitis, left hip: Not currently symptomatic.  Closed compression fracture of body of L1 vertebra (HCC) - 03/28/2022: Degenerative joint changes of the lower thoracic spine and lumbar spine.  20% chronic compression deformity of L1.  Vitamin D deficiency: She is taking a calcium and vitamin D supplement daily.  History of hypertension: Blood pressure was 140/90 today in the office.  Patient was advised to monitor blood pressure closely and to reach out to PCP if her blood pressure remains elevated.  Other fatigue: Chronic, stable.  Orders: Orders Placed This Encounter  Procedures   Large Joint Inj: R knee   CBC with Differential/Platelet   Comprehensive metabolic panel with GFR   CBC with Differential/Platelet   Comprehensive metabolic panel with GFR   No orders of the defined types were placed in this encounter.   Follow-Up Instructions: Return in about 8 weeks (around 03/18/2024) for Psoriatic arthritis, Osteoarthritis.   Romayne Clubs, PA-C  Note - This record has been created using Dragon software.  Chart creation errors have been sought, but may not always  have been located. Such creation errors do not reflect on  the standard of medical care.

## 2024-01-22 ENCOUNTER — Ambulatory Visit: Attending: Physician Assistant | Admitting: Physician Assistant

## 2024-01-22 ENCOUNTER — Encounter: Payer: Self-pay | Admitting: Physician Assistant

## 2024-01-22 VITALS — BP 140/90 | HR 80 | Resp 16 | Ht 65.0 in | Wt 245.2 lb

## 2024-01-22 DIAGNOSIS — M19041 Primary osteoarthritis, right hand: Secondary | ICD-10-CM | POA: Diagnosis not present

## 2024-01-22 DIAGNOSIS — M19071 Primary osteoarthritis, right ankle and foot: Secondary | ICD-10-CM

## 2024-01-22 DIAGNOSIS — S32010A Wedge compression fracture of first lumbar vertebra, initial encounter for closed fracture: Secondary | ICD-10-CM | POA: Diagnosis not present

## 2024-01-22 DIAGNOSIS — M7062 Trochanteric bursitis, left hip: Secondary | ICD-10-CM

## 2024-01-22 DIAGNOSIS — M170 Bilateral primary osteoarthritis of knee: Secondary | ICD-10-CM | POA: Diagnosis not present

## 2024-01-22 DIAGNOSIS — M51369 Other intervertebral disc degeneration, lumbar region without mention of lumbar back pain or lower extremity pain: Secondary | ICD-10-CM

## 2024-01-22 DIAGNOSIS — L4050 Arthropathic psoriasis, unspecified: Secondary | ICD-10-CM

## 2024-01-22 DIAGNOSIS — R5383 Other fatigue: Secondary | ICD-10-CM

## 2024-01-22 DIAGNOSIS — M19042 Primary osteoarthritis, left hand: Secondary | ICD-10-CM

## 2024-01-22 DIAGNOSIS — M7701 Medial epicondylitis, right elbow: Secondary | ICD-10-CM

## 2024-01-22 DIAGNOSIS — Z79899 Other long term (current) drug therapy: Secondary | ICD-10-CM | POA: Diagnosis not present

## 2024-01-22 DIAGNOSIS — L409 Psoriasis, unspecified: Secondary | ICD-10-CM | POA: Diagnosis not present

## 2024-01-22 DIAGNOSIS — G8929 Other chronic pain: Secondary | ICD-10-CM | POA: Diagnosis not present

## 2024-01-22 DIAGNOSIS — E559 Vitamin D deficiency, unspecified: Secondary | ICD-10-CM

## 2024-01-22 DIAGNOSIS — M25561 Pain in right knee: Secondary | ICD-10-CM

## 2024-01-22 DIAGNOSIS — M19072 Primary osteoarthritis, left ankle and foot: Secondary | ICD-10-CM

## 2024-01-22 DIAGNOSIS — Z8679 Personal history of other diseases of the circulatory system: Secondary | ICD-10-CM

## 2024-01-22 MED ORDER — CLOBETASOL PROPIONATE 0.05 % EX SOLN: 1.0000 | Freq: Two times a day (BID) | CUTANEOUS | 0 refills | Status: DC

## 2024-01-22 MED ORDER — LIDOCAINE HCL 1 % IJ SOLN
1.5000 mL | INTRAMUSCULAR | Status: AC | PRN
  Administered 2024-01-22: 1.5 mL

## 2024-01-22 MED ORDER — TRIAMCINOLONE ACETONIDE 40 MG/ML IJ SUSP
40.0000 mg | INTRAMUSCULAR | Status: AC | PRN
Start: 2024-01-22 — End: 2024-01-22
  Administered 2024-01-22: 40 mg via INTRA_ARTICULAR

## 2024-01-22 NOTE — Progress Notes (Signed)
 Pharmacy Note  Subjective:  Patient presents today to [facility] for follow up office visit.  Patient was seen by the pharmacist for counseling on Taltz for psoriatic arthritis and plaque psoriasis..  Prior therapy includes: adalimumab (last dose was 01/21/2024)., Enbrel  History of inflammatory bowel disease: No  Objective:  CBC    Component Value Date/Time   WBC 4.4 08/16/2023 0855   RBC 5.01 08/16/2023 0855   HGB 14.1 08/16/2023 0855   HGB 13.8 06/13/2017 1101   HCT 43.3 08/16/2023 0855   HCT 43.0 06/13/2017 1101   PLT 244 08/16/2023 0855   PLT 227 06/13/2017 1101   MCV 86.4 08/16/2023 0855   MCV 90 06/13/2017 1101   MCH 28.1 08/16/2023 0855   MCHC 32.6 08/16/2023 0855   RDW 13.4 08/16/2023 0855   RDW 15.2 06/13/2017 1101   LYMPHSABS 1,588 02/17/2023 0805   LYMPHSABS 1.6 06/13/2017 1101   MONOABS 480 03/24/2017 1211   EOSABS 40 08/16/2023 0855   EOSABS 0.0 06/13/2017 1101   BASOSABS 62 08/16/2023 0855   BASOSABS 0.0 06/13/2017 1101    CMP     Component Value Date/Time   NA 140 08/16/2023 0855   NA 141 06/13/2017 1101   K 4.4 08/16/2023 0855   CL 106 08/16/2023 0855   CO2 25 08/16/2023 0855   GLUCOSE 126 (H) 08/16/2023 0855   BUN 16 08/16/2023 0855   BUN 7 (L) 06/13/2017 1101   CREATININE 0.68 08/16/2023 0855   CALCIUM 8.7 08/16/2023 0855   PROT 6.6 08/16/2023 0855   PROT 6.3 06/13/2017 1101   ALBUMIN 4.1 06/13/2017 1101   AST 18 08/16/2023 0855   ALT 21 08/16/2023 0855   ALKPHOS 62 06/13/2017 1101   BILITOT 0.7 08/16/2023 0855   BILITOT 1.1 06/13/2017 1101   GFRNONAA 94 11/17/2020 0842   GFRAA 109 11/17/2020 0842    Baseline Immunosuppressant Therapy Labs     Latest Ref Rng & Units 08/16/2023    8:55 AM  Quantiferon TB Gold  Quantiferon TB Gold Plus NEGATIVE NEGATIVE        No results found for: "HIV"        Latest Ref Rng & Units 08/16/2023    8:55 AM  Serum Protein Electrophoresis  Total Protein 6.1 - 8.1 g/dL 6.6     Chest WGNF:03/12/1307 - No active cardiopulmonary disease.   Assessment/Plan:  Counseled patient that Delford Felling is a IL-17 inhibitor that works to reduce pain and inflammation associated with arthritis.  Counseled patient on purpose, proper use, and adverse effects of Taltz. Reviewed the most common adverse effects of infection (more commonly nasopharyngitis, URTI), inflammatory bowel disease, and allergic reaction. Counseled patient that Delford Felling should be held for infection and prior to scheduled surgery.  Counseled patient to avoid live vaccines while on Taltz. Recommend annual influenza, PCV 15 or PCV20 or Pneumovax 23, and Shingrix as indicated. Reviewed storage information for Taltz.  Reviewed the importance of regular labs while on Taltz. Will monitor CBC and CMP 1 month after starting and every 3 months routinely thereafter. Will monitor TB gold annually. Standing orders placed. Provided patient with medication education material and answered all questions.  Patient consented to Taltz.  Will upload consent into patient's chart.  Will apply for Taltz through patient's insurance and update when we receive a response.  Advised initial injection must be administered in office.  Patient voiced understanding.    Taltz dose will be: For psoriatic arthritis and plaque psoriasis overlap load of  160 mg then 80 mg on weeks 2,4,6,8,10,12 then 80 mg every 28 days  Prescription will be sent to pharmacy pending lab results and insurance approval.   She will be able to start Taltz on or after 02/04/24. She may be eligible for patient assistance through [employer]. She will confirm her household income with her husband and we will apply for patient assistance at new start visit on 02/05/2024  Geraldene Kleine, PharmD, MPH, BCPS, CPP Clinical Pharmacist (Rheumatology and Pulmonology)

## 2024-01-23 LAB — CBC WITH DIFFERENTIAL/PLATELET
Absolute Lymphocytes: 1777 {cells}/uL (ref 850–3900)
Absolute Monocytes: 491 {cells}/uL (ref 200–950)
Basophils Absolute: 92 {cells}/uL (ref 0–200)
Basophils Relative: 1.7 %
Eosinophils Absolute: 92 {cells}/uL (ref 15–500)
Eosinophils Relative: 1.7 %
HCT: 40.9 % (ref 35.0–45.0)
Hemoglobin: 13 g/dL (ref 11.7–15.5)
MCH: 25.9 pg — ABNORMAL LOW (ref 27.0–33.0)
MCHC: 31.8 g/dL — ABNORMAL LOW (ref 32.0–36.0)
MCV: 81.6 fL (ref 80.0–100.0)
MPV: 11 fL (ref 7.5–12.5)
Monocytes Relative: 9.1 %
Neutro Abs: 2948 {cells}/uL (ref 1500–7800)
Neutrophils Relative %: 54.6 %
Platelets: 251 10*3/uL (ref 140–400)
RBC: 5.01 10*6/uL (ref 3.80–5.10)
RDW: 14.6 % (ref 11.0–15.0)
Total Lymphocyte: 32.9 %
WBC: 5.4 10*3/uL (ref 3.8–10.8)

## 2024-01-23 LAB — COMPREHENSIVE METABOLIC PANEL WITH GFR
AG Ratio: 1.6 (calc) (ref 1.0–2.5)
ALT: 15 U/L (ref 6–29)
AST: 14 U/L (ref 10–35)
Albumin: 4 g/dL (ref 3.6–5.1)
Alkaline phosphatase (APISO): 87 U/L (ref 37–153)
BUN: 14 mg/dL (ref 7–25)
CO2: 31 mmol/L (ref 20–32)
Calcium: 9 mg/dL (ref 8.6–10.4)
Chloride: 102 mmol/L (ref 98–110)
Creat: 0.67 mg/dL (ref 0.50–1.05)
Globulin: 2.5 g/dL (ref 1.9–3.7)
Glucose, Bld: 107 mg/dL — ABNORMAL HIGH (ref 65–99)
Potassium: 3.9 mmol/L (ref 3.5–5.3)
Sodium: 140 mmol/L (ref 135–146)
Total Bilirubin: 0.6 mg/dL (ref 0.2–1.2)
Total Protein: 6.5 g/dL (ref 6.1–8.1)
eGFR: 96 mL/min/{1.73_m2} (ref >=60)

## 2024-01-23 NOTE — Progress Notes (Signed)
CBC stable. Glucose is 107.  Rest of CMP WNL.

## 2024-01-29 ENCOUNTER — Telehealth: Payer: Self-pay | Admitting: *Deleted

## 2024-01-29 NOTE — Telephone Encounter (Signed)
 Patient contacted the office requesting to speak to [facility]. Patient states she spoke with the insurance company and the only thing they will approve is the Humira . Patient is requesting a call back.

## 2024-01-30 NOTE — Telephone Encounter (Signed)
 Patient has had waning response to Humira  so we will still move forward with Taltz on 02/05/2024. ATC patient - unable to reach.   Geraldene Kleine, PharmD, MPH, BCPS, CPP Clinical Pharmacist (Rheumatology and Pulmonology)

## 2024-02-04 NOTE — Progress Notes (Unsigned)
 Pharmacy Note  Subjective:   Patient presents to clinic today to receive first dose of Taltz for PsA/PsO. Patient currently takes adalimumab . States that since switching from Humira  to biosimilar she has had waning response  Patient running a fever or have signs/symptoms of infection? No  Patient currently on antibiotics for the treatment of infection? No  Patient have any upcoming invasive procedures/surgeries? No  Objective: CMP     Component Value Date/Time   NA 140 01/22/2024 0926   NA 141 06/13/2017 1101   K 3.9 01/22/2024 0926   CL 102 01/22/2024 0926   CO2 31 01/22/2024 0926   GLUCOSE 107 (H) 01/22/2024 0926   BUN 14 01/22/2024 0926   BUN 7 (L) 06/13/2017 1101   CREATININE 0.67 01/22/2024 0926   CALCIUM 9.0 01/22/2024 0926   PROT 6.5 01/22/2024 0926   PROT 6.3 06/13/2017 1101   ALBUMIN 4.1 06/13/2017 1101   AST 14 01/22/2024 0926   ALT 15 01/22/2024 0926   ALKPHOS 62 06/13/2017 1101   BILITOT 0.6 01/22/2024 0926   BILITOT 1.1 06/13/2017 1101   GFRNONAA 94 11/17/2020 0842   GFRAA 109 11/17/2020 0842    CBC    Component Value Date/Time   WBC 5.4 01/22/2024 0926   RBC 5.01 01/22/2024 0926   HGB 13.0 01/22/2024 0926   HGB 13.8 06/13/2017 1101   HCT 40.9 01/22/2024 0926   HCT 43.0 06/13/2017 1101   PLT 251 01/22/2024 0926   PLT 227 06/13/2017 1101   MCV 81.6 01/22/2024 0926   MCV 90 06/13/2017 1101   MCH 25.9 (L) 01/22/2024 0926   MCHC 31.8 (L) 01/22/2024 0926   RDW 14.6 01/22/2024 0926   RDW 15.2 06/13/2017 1101   LYMPHSABS 1,588 02/17/2023 0805   LYMPHSABS 1.6 06/13/2017 1101   MONOABS 480 03/24/2017 1211   EOSABS 92 01/22/2024 0926   EOSABS 0.0 06/13/2017 1101   BASOSABS 92 01/22/2024 0926   BASOSABS 0.0 06/13/2017 1101    Baseline Immunosuppressant Therapy Labs TB GOLD    Latest Ref Rng & Units 08/16/2023    8:55 AM  Quantiferon TB Gold  Quantiferon TB Gold Plus NEGATIVE NEGATIVE    Hepatitis Panel   HIV No results found for:  "HIV" Immunoglobulins   SPEP    Latest Ref Rng & Units 01/22/2024    9:26 AM  Serum Protein Electrophoresis  Total Protein 6.1 - 8.1 g/dL 6.5    Chest x-ray: 4/0/9811 - No active cardiopulmonary disease.  Assessment/Plan:  Reviewed importance of holding Taltz with signs/symptoms of an infections, if antibiotics are prescribed to treat an active infection, and with invasive procedures  Demonstrated proper injection technique with Taltz demo device  Patient able to demonstrate proper injection technique using the teach back method.  Patient self injected in the right upper thigh and left upper thigh with:  Sample Medication: Taltz 80mg /mL pen injector x 2 = 160mg  total dose Lot: [id number] AE Expiration: 03/21/2025  Patient tolerated well.  Observed for 30 mins in office for adverse reaction. Patient denies itchiness and irritation at injection., No swelling or redness noted., and Reviewed injection site reaction management with patient verbally and printed information for review in AVS  Patient is to return in 1 month for labs and 6-8 weeks for follow-up appointment.  Standing orders for CBC/CMP placed.  TB gold will be monitored yearly.  Patient is switching to Medicare on 02/08/24. [employer] PAP completed by patient today. Provider form completed.  Patient will continue Taltz 160 mg subcut  at Week 0 (administered in clinic today), then 80 mg on weeks 2, 4, 6, 8, 10, 12 then 80 mg every 28 days.  All questions encouraged and answered.  Instructed patient to call with any further questions or concerns.  Geraldene Kleine, PharmD, MPH, BCPS, CPP Clinical Pharmacist (Rheumatology and Pulmonology)  02/04/2024 10:12 AM

## 2024-02-05 ENCOUNTER — Ambulatory Visit: Attending: Rheumatology | Admitting: Pharmacist

## 2024-02-05 DIAGNOSIS — L409 Psoriasis, unspecified: Secondary | ICD-10-CM | POA: Diagnosis not present

## 2024-02-05 DIAGNOSIS — L4050 Arthropathic psoriasis, unspecified: Secondary | ICD-10-CM

## 2024-02-05 DIAGNOSIS — Z7189 Other specified counseling: Secondary | ICD-10-CM

## 2024-02-05 DIAGNOSIS — Z79899 Other long term (current) drug therapy: Secondary | ICD-10-CM

## 2024-02-05 MED ORDER — TALTZ 80 MG/ML ~~LOC~~ SOAJ
SUBCUTANEOUS | Status: DC
Start: 2024-02-05 — End: 2024-02-12

## 2024-02-05 NOTE — Patient Instructions (Signed)
 Your next TALTZ dose is due on 02/19/24, 03/04/24, 03/18/24, 04/01/24, 04/15/24, 04/29/24 then every 4 weeks thereafter (starting on 05/27/24)  HOLD TALTZ if you have signs or symptoms of an infection. You can resume once you feel better or back to your baseline. HOLD TALTZ if you start antibiotics to treat an infection. HOLD TALTZ around the time of surgery/procedures. Your surgeon will be able to provide recommendations on when to hold BEFORE and when you are cleared to RESUME.  Pharmacy information: Will call and provide additional information  Labs are due in 1 month then every 3 months. Lab hours are from Monday to Thursday 8am-12:30pm and 1pm-5pm and Friday 8am-12pm. You do not need an appointment if you come for labs during these times. If you'd like to go to a [facility] or [facility] closer to home, please call our clinic 48 hours prior to lab date so we can release orders in a timely manner.  Stay up to date on all routine vaccines: influenza, pneumonia, COVID19, Shingles  How to manage an injection site reaction: Remember the 5 C's: COUNTER - leave on the counter at least 30 minutes but up to overnight to bring medication to room temperature. This may help prevent stinging COLD - place something cold (like an ice gel pack or cold water bottle) on the injection site just before cleansing with alcohol. This may help reduce pain CLARITIN - use Claritin (generic name is loratadine) for the first two weeks of treatment or the day of, the day before, and the day after injecting. This will help to minimize injection site reactions CORTISONE CREAM - apply if injection site is irritated and itching CALL ME - if injection site reaction is bigger than the size of your fist, looks infected, blisters, or if you develop hives

## 2024-02-08 ENCOUNTER — Telehealth: Payer: Self-pay

## 2024-02-08 DIAGNOSIS — Z79899 Other long term (current) drug therapy: Secondary | ICD-10-CM

## 2024-02-08 DIAGNOSIS — L409 Psoriasis, unspecified: Secondary | ICD-10-CM

## 2024-02-08 DIAGNOSIS — I10 Essential (primary) hypertension: Secondary | ICD-10-CM

## 2024-02-08 DIAGNOSIS — L4050 Arthropathic psoriasis, unspecified: Secondary | ICD-10-CM

## 2024-02-08 HISTORY — DX: Essential (primary) hypertension: I10

## 2024-02-08 NOTE — Telephone Encounter (Addendum)
 Submitted a Prior Authorization request to [employer] for TALTZ  via CoverMyMeds. Will update once we receive a response.  Key: [id number]   ----- Message from Thais Fill sent at 02/05/2024  9:24 AM EDT ----- Patient is switching to Medicare on 02/08/24. [employer] PAP completed by patient today. Provider form pending.  Patient will continue Taltz  160 mg subcut at Week 0 (administered in clinic today), then 80 mg on weeks 2,4,6,8,10,12 then 80 mg every 28 days.

## 2024-02-12 ENCOUNTER — Other Ambulatory Visit (HOSPITAL_COMMUNITY): Payer: Self-pay

## 2024-02-12 MED ORDER — TALTZ 80 MG/ML ~~LOC~~ SOAJ: SUBCUTANEOUS | Status: AC

## 2024-02-12 NOTE — Telephone Encounter (Signed)
 Patient returned call. Discussed PAP approval. She will f/u with company. She requested email with dosing schedule for Taltz   Geraldene Kleine, PharmD, MPH, BCPS, CPP Clinical Pharmacist (Rheumatology and Pulmonology)

## 2024-02-12 NOTE — Addendum Note (Signed)
 Addended by: Thais Fill on: 02/12/2024 09:56 AM   Modules accepted: Orders

## 2024-02-12 NOTE — Telephone Encounter (Signed)
 Received a fax from  [facility] regarding an approval for TALTZ  patient assistance from 02/12/2024 to 10/09/2024. Approval letter sent to scan center.  Case # [id number] Phone: [phone] Fax: [phone]  ATC patient. Left VM with phone number for [employer] and advised to call towards end of the week to set up shipment to home  Geraldene Kleine, PharmD, MPH, BCPS, CPP Clinical Pharmacist (Rheumatology and Pulmonology)

## 2024-02-12 NOTE — Telephone Encounter (Signed)
 Received notification from [employer] regarding a prior authorization for TALTZ . Authorization has been APPROVED from 02/08/24 to 02/07/25. Approval letter sent to scan center.  Per test claim, copay for 28 days supply is $2000  Authorization # [id number]  Submitted Patient Assistance Application to [facility] for TALTZ  along with provider portion, patient portion, medication list, insurance card copy. Will update patient when we receive a response.  Phone: [phone] Fax: [phone]

## 2024-03-05 NOTE — Progress Notes (Unsigned)
 Office Visit Note  Patient: Karen Bryant             Date of Birth: 02-01-1956           MRN: [id number]             PCP: Theoplis Fix, MD Referring: Theoplis Fix, MD Visit Date: 03/19/2024 Occupation: @GUAROCC @  Subjective:  Medication monitoring   History of Present Illness: Karen Bryant is a 68 y.o. female with history of psoriatic arthritis and osteoarthritis.  Patient is prescribed Taltz  80 mg sq injections every 28 days. Patient was started on taltz  on 02/05/24.  She is tolerating Taltz  that any side effects.  Patient states that she has noticed an 80% improvement in her symptoms since initiating Taltz .  She continues to have intermittent discomfort and stiffness in the right knee especially when climbing steps or rising from a seated position.  At times the pain is a 6 out of 10 but while sitting right now she is not in any discomfort.  She denies any joint swelling at this time.  She denies any Achilles tendinitis or plantar fasciitis.  She has occasional discomfort in her SI joints but the discomfort has been more manageable.  She denies any psoriasis at this time.  She denies any recent or recurrent infections. Patient requested a referral to dermatology.      Activities of Daily Living:  Patient reports morning stiffness for 30 minutes.   Patient Denies nocturnal pain.  Difficulty dressing/grooming: Denies Difficulty climbing stairs: Reports Difficulty getting out of chair: Reports Difficulty using hands for taps, buttons, cutlery, and/or writing: Denies  Review of Systems  Constitutional:  Negative for fatigue.  HENT:  Negative for mouth sores and mouth dryness.   Eyes:  Negative for dryness.  Respiratory:  Negative for shortness of breath.   Cardiovascular:  Negative for chest pain and palpitations.  Gastrointestinal:  Negative for blood in stool, constipation and diarrhea.  Endocrine: Negative for increased urination.  Genitourinary:  Negative for involuntary  urination.  Musculoskeletal:  Positive for joint pain, joint pain, joint swelling and morning stiffness. Negative for gait problem, myalgias, muscle weakness, muscle tenderness and myalgias.  Skin:  Positive for color change. Negative for rash, hair loss and sensitivity to sunlight.  Allergic/Immunologic: Negative for susceptible to infections.  Neurological:  Negative for dizziness and headaches.  Hematological:  Negative for swollen glands.  Psychiatric/Behavioral:  Negative for depressed mood and sleep disturbance. The patient is not nervous/anxious.     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   Essential hypertension 01/20/2017   History of depression 01/20/2017   Psoriatic arthropathy (HCC) 01/16/2017   Psoriasis 01/16/2017   Primary osteoarthritis of both feet 01/16/2017   Primary osteoarthritis of both hands 01/16/2017   Primary osteoarthritis of both knees 01/16/2017    Past Medical History:  Diagnosis Date   Hypertension 02/2024   Psoriatic arthritis (HCC)     Family History  Problem Relation Age of Onset   Alzheimer's disease Mother    Heart failure Mother    Parkinson's disease Father    Cancer Father        prostate   Cancer Sister        breast cancer    Healthy Daughter    Healthy Daughter    Past Surgical History:  Procedure Laterality Date   CHOLECYSTECTOMY  03/10/2021  TUBAL LIGATION     Social History   Social History Narrative   Not on file   Immunization History  Administered Date(s) Administered   Moderna Sars-Covid-2 Vaccination 01/09/2020, 02/08/2020     Objective: Vital Signs: BP 121/83 (BP Location: Left Arm, Patient Position: Sitting, Cuff Size: Normal)   Pulse 80   Resp 16   Ht 5\' 5"  (1.651 m)   Wt 233 lb 6.4 oz (105.9 kg)   BMI 38.84 kg/m    Physical Exam Vitals and nursing note reviewed.  Constitutional:      Appearance: She is  well-developed.  HENT:     Head: Normocephalic and atraumatic.  Eyes:     Conjunctiva/sclera: Conjunctivae normal.  Cardiovascular:     Rate and Rhythm: Normal rate and regular rhythm.     Heart sounds: Normal heart sounds.  Pulmonary:     Effort: Pulmonary effort is normal.     Breath sounds: Normal breath sounds.  Abdominal:     General: Bowel sounds are normal.     Palpations: Abdomen is soft.  Musculoskeletal:     Cervical back: Normal range of motion.  Lymphadenopathy:     Cervical: No cervical adenopathy.  Skin:    General: Skin is warm and dry.     Capillary Refill: Capillary refill takes less than 2 seconds.  Neurological:     Mental Status: She is alert and oriented to person, place, and time.  Psychiatric:        Behavior: Behavior normal.      Musculoskeletal Exam: C-spine, thoracic spine, and lumbar spine good ROM.  No midline spinal tenderness.  Mild left SI joint tenderness.  Shoulder joints, elbow joints, wrist joints, MCPs, PIPs, and DIPs good ROM with no synovitis.  Complete fist formation bilaterally.  PIP and DIP thickening.  Hip joints have good ROM with no groin pain.  Right knee has mild warmth but no effusion.  Left knee has good ROM with no warmth or effusion.  Ankle joints have good ROM with no tenderness.  No evidence of achilles tendonitis.  Pedal edema in bilateral LE.  CDAI Exam: CDAI Score: -- Patient Global: --; Provider Global: -- Swollen: --; Tender: -- Joint Exam 03/19/2024   No joint exam has been documented for this visit   There is currently no information documented on the homunculus. Go to the Rheumatology activity and complete the homunculus joint exam.  Investigation: No additional findings.  Imaging: No results found.  Recent Labs: Lab Results  Component Value Date   WBC 5.4 01/22/2024   HGB 13.0 01/22/2024   PLT 251 01/22/2024   NA 140 01/22/2024   K 3.9 01/22/2024   CL 102 01/22/2024   CO2 31 01/22/2024   GLUCOSE 107  (H) 01/22/2024   BUN 14 01/22/2024   CREATININE 0.67 01/22/2024   BILITOT 0.6 01/22/2024   ALKPHOS 62 06/13/2017   AST 14 01/22/2024   ALT 15 01/22/2024   PROT 6.5 01/22/2024   ALBUMIN 4.1 06/13/2017   CALCIUM 9.0 01/22/2024   GFRAA 109 11/17/2020   QFTBGOLDPLUS NEGATIVE 08/16/2023    Speciality Comments: No specialty comments available.  Procedures:  No procedures performed Allergies: Patient has no known allergies.   Assessment / Plan:     Visit Diagnoses: Psoriatic arthropathy (HCC): No synovitis or dactylitis noted on examination today.  No evidence of Achilles tendinitis or plantar fasciitis.  Mild left SI joint tenderness.  She continues to have intermittent discomfort in the right knee  particularly with climbing steps and rising from a seated position.  She has no active psoriasis at this time.  She has noticed an 80% improvement in her symptoms since initiating Taltz  on 02/05/2024.  She is tolerating Taltz  without any side effects and has not had any recent gaps in therapy.  No recent or recurrent infections.  Patient will remain on Taltz  as monotherapy.  She was advised to notify us  if she develops any new or worsening symptoms.  She will follow-up in the office in 3 months or sooner if needed.  High risk medication use - Taltz  80 mg sq injections every 28 days.  IR Adalimumab -adaz 40 mg sq injections q14 days.Previous tx: Enbrel, Humira   Taltz  was started on 02/05/24.  CBC and CMP updated on 01/22/24. Orders for CBC and CMP released today. Her next lab work will be due in September and every 3 months.  TB gold negative on 08/16/23.   No recent or recurrent infections. Discussed the importance of holding taltz  if she develops signs or symptoms of an infection and to resume once the infection has completely cleared. - Plan: CBC with Differential/Platelet, Comprehensive metabolic panel with GFR  Primary osteoarthritis of both hands: She has PIP and DIP thickening consistent  osteoarthritis of both hands.  No synovitis or dactylitis noted on exam.  Complete fist formation bilaterally.  Chronic pain of right knee - XR Rknee 06/01/2021 moderate osteoarthritis.  She had a right knee joint cortisone injection performed on 08/16/2023 and 01/22/24.  The cortisone injection provided significant relief but her symptoms have gradually started to return.  Overall her pain has been intermittent but is exacerbated by climbing steps or rising from a seated position.  She has mild warmth of the right knee but no effusion on exam.  She will remain on Taltz  as prescribed.  Primary osteoarthritis of both knees: She has good range of motion of the left knee joint on examination today.  No warmth or effusion.  She continues to have some residual warmth in the right knee but no effusion was noted.  She has difficulty climbing steps and rising from a seated position.  Primary osteoarthritis of both feet: She is not experiencing any increased discomfort in her feet at this time.  She has good range of motion of both ankle joints with no tenderness or joint swelling.  Pedal edema noted bilateral lower extremities.  No evidence of Achilles tendinitis or plantar fasciitis.  Lumbar spondylosis: No midline spinal tenderness.  No symptoms of radiculopathy.  She is occasional discomfort in her SI joints but overall her symptoms have been manageable.  Medial epicondylitis of elbow, right: Resolved.  No tenderness upon palpation.  Trochanteric bursitis, left hip: Not currently symptomatic.  Skin exam, screening for cancer: Patient requested referral to dermatology for skin cancer screening--she will notify us  of the name of the dermatology practice she would like to be referred to.  Other medical conditions are listed as follows:  Closed compression fracture of body of L1 vertebra (HCC) - 03/28/2022: Degenerative joint changes of the lower thoracic spine and lumbar spine.  20% chronic compression  deformity of L1. She is taking Fosamax 70 mg 1 tablet by mouth once weekly.  Vitamin D  deficiency  History of hypertension: Blood pressure was 121/83 today in the office.  Other fatigue  Orders: Orders Placed This Encounter  Procedures   CBC with Differential/Platelet   Comprehensive metabolic panel with GFR   No orders of the defined types were placed  in this encounter.   Follow-Up Instructions: Return in about 3 months (around 06/19/2024) for Psoriatic arthritis.   Romayne Clubs, PA-C  Note - This record has been created using Dragon software.  Chart creation errors have been sought, but may not always  have been located. Such creation errors do not reflect on  the standard of medical care.

## 2024-03-07 ENCOUNTER — Other Ambulatory Visit: Payer: Self-pay | Admitting: Rheumatology

## 2024-03-07 DIAGNOSIS — Z79899 Other long term (current) drug therapy: Secondary | ICD-10-CM

## 2024-03-07 DIAGNOSIS — L409 Psoriasis, unspecified: Secondary | ICD-10-CM

## 2024-03-07 DIAGNOSIS — L4050 Arthropathic psoriasis, unspecified: Secondary | ICD-10-CM

## 2024-03-19 ENCOUNTER — Ambulatory Visit: Attending: Physician Assistant | Admitting: Physician Assistant

## 2024-03-19 ENCOUNTER — Other Ambulatory Visit: Payer: Self-pay | Admitting: *Deleted

## 2024-03-19 ENCOUNTER — Encounter: Payer: Self-pay | Admitting: Physician Assistant

## 2024-03-19 ENCOUNTER — Telehealth: Payer: Self-pay | Admitting: Rheumatology

## 2024-03-19 VITALS — BP 121/83 | HR 80 | Resp 16 | Ht 65.0 in | Wt 233.4 lb

## 2024-03-19 DIAGNOSIS — M25561 Pain in right knee: Secondary | ICD-10-CM | POA: Insufficient documentation

## 2024-03-19 DIAGNOSIS — Z1283 Encounter for screening for malignant neoplasm of skin: Secondary | ICD-10-CM | POA: Insufficient documentation

## 2024-03-19 DIAGNOSIS — Z8679 Personal history of other diseases of the circulatory system: Secondary | ICD-10-CM | POA: Insufficient documentation

## 2024-03-19 DIAGNOSIS — M7062 Trochanteric bursitis, left hip: Secondary | ICD-10-CM | POA: Insufficient documentation

## 2024-03-19 DIAGNOSIS — M47816 Spondylosis without myelopathy or radiculopathy, lumbar region: Secondary | ICD-10-CM | POA: Insufficient documentation

## 2024-03-19 DIAGNOSIS — E559 Vitamin D deficiency, unspecified: Secondary | ICD-10-CM | POA: Insufficient documentation

## 2024-03-19 DIAGNOSIS — S32010A Wedge compression fracture of first lumbar vertebra, initial encounter for closed fracture: Secondary | ICD-10-CM | POA: Diagnosis present

## 2024-03-19 DIAGNOSIS — L4050 Arthropathic psoriasis, unspecified: Secondary | ICD-10-CM | POA: Diagnosis not present

## 2024-03-19 DIAGNOSIS — M19041 Primary osteoarthritis, right hand: Secondary | ICD-10-CM | POA: Diagnosis not present

## 2024-03-19 DIAGNOSIS — M19072 Primary osteoarthritis, left ankle and foot: Secondary | ICD-10-CM | POA: Diagnosis present

## 2024-03-19 DIAGNOSIS — M51369 Other intervertebral disc degeneration, lumbar region without mention of lumbar back pain or lower extremity pain: Secondary | ICD-10-CM

## 2024-03-19 DIAGNOSIS — M19042 Primary osteoarthritis, left hand: Secondary | ICD-10-CM | POA: Insufficient documentation

## 2024-03-19 DIAGNOSIS — Z79899 Other long term (current) drug therapy: Secondary | ICD-10-CM | POA: Insufficient documentation

## 2024-03-19 DIAGNOSIS — M170 Bilateral primary osteoarthritis of knee: Secondary | ICD-10-CM | POA: Insufficient documentation

## 2024-03-19 DIAGNOSIS — M19071 Primary osteoarthritis, right ankle and foot: Secondary | ICD-10-CM | POA: Diagnosis present

## 2024-03-19 DIAGNOSIS — R5383 Other fatigue: Secondary | ICD-10-CM | POA: Insufficient documentation

## 2024-03-19 DIAGNOSIS — G8929 Other chronic pain: Secondary | ICD-10-CM | POA: Insufficient documentation

## 2024-03-19 DIAGNOSIS — M7701 Medial epicondylitis, right elbow: Secondary | ICD-10-CM | POA: Insufficient documentation

## 2024-03-19 LAB — CBC WITH DIFFERENTIAL/PLATELET
Absolute Lymphocytes: 1404 {cells}/uL (ref 850–3900)
Absolute Monocytes: 416 {cells}/uL (ref 200–950)
Basophils Absolute: 52 {cells}/uL (ref 0–200)
Basophils Relative: 1.3 %
Eosinophils Absolute: 60 {cells}/uL (ref 15–500)
Eosinophils Relative: 1.5 %
HCT: 41.4 % (ref 35.0–45.0)
Hemoglobin: 13.2 g/dL (ref 11.7–15.5)
MCH: 26 pg — ABNORMAL LOW (ref 27.0–33.0)
MCHC: 31.9 g/dL — ABNORMAL LOW (ref 32.0–36.0)
MCV: 81.7 fL (ref 80.0–100.0)
MPV: 10.5 fL (ref 7.5–12.5)
Monocytes Relative: 10.4 %
Neutro Abs: 2068 {cells}/uL (ref 1500–7800)
Neutrophils Relative %: 51.7 %
Platelets: 210 Thousand/uL (ref 140–400)
RBC: 5.07 Million/uL (ref 3.80–5.10)
RDW: 16.7 % — ABNORMAL HIGH (ref 11.0–15.0)
Total Lymphocyte: 35.1 %
WBC: 4 Thousand/uL (ref 3.8–10.8)

## 2024-03-19 LAB — COMPREHENSIVE METABOLIC PANEL WITH GFR
AG Ratio: 1.7 (calc) (ref 1.0–2.5)
ALT: 13 U/L (ref 6–29)
AST: 14 U/L (ref 10–35)
Albumin: 4.1 g/dL (ref 3.6–5.1)
Alkaline phosphatase (APISO): 79 U/L (ref 37–153)
BUN: 14 mg/dL (ref 7–25)
CO2: 27 mmol/L (ref 20–32)
Calcium: 8.8 mg/dL (ref 8.6–10.4)
Chloride: 100 mmol/L (ref 98–110)
Creat: 0.81 mg/dL (ref 0.50–1.05)
Globulin: 2.4 g/dL (ref 1.9–3.7)
Glucose, Bld: 101 mg/dL — ABNORMAL HIGH (ref 65–99)
Potassium: 4.4 mmol/L (ref 3.5–5.3)
Sodium: 136 mmol/L (ref 135–146)
Total Bilirubin: 0.7 mg/dL (ref 0.2–1.2)
Total Protein: 6.5 g/dL (ref 6.1–8.1)
eGFR: 80 mL/min/{1.73_m2} (ref >=60)

## 2024-03-19 NOTE — Telephone Encounter (Signed)
 Patient contacted the office requesting a referral to [facility].  Patient request referral for dermatologist .  Patient's preferred area for referral is: [facility]   [address] [place], [address] [place], [place] [address]

## 2024-03-19 NOTE — Telephone Encounter (Signed)
 Referral placed.

## 2024-03-20 ENCOUNTER — Ambulatory Visit: Payer: Self-pay | Admitting: Physician Assistant

## 2024-03-20 NOTE — Progress Notes (Signed)
CMP WNL. CBC stable.

## 2024-05-01 NOTE — Addendum Note (Signed)
 Addended by: DAYNE SHERRY RAMAN on: 05/01/2024 09:53 AM   Modules accepted: Level of Service

## 2024-06-05 NOTE — Progress Notes (Unsigned)
 Office Visit Note  Patient: Karen Bryant             Date of Birth: 17-Nov-1955           MRN: [id number]             PCP: Maree Isles, MD Referring: Maree Isles, MD Visit Date: 06/19/2024 Occupation: @GUAROCC @  Subjective:  Medication monitoring   History of Present Illness: Karen Bryant is a 68 y.o. female with history of psoriatic arthritis and osteoarthritis.  Patient remains on Taltz  80 mg sq injections every 28 days. Patient was initiated on taltz  on 02/05/24.  She is tolerating Taltz  without any side effects or injection site reactions.  She has noticed a significant improvement in her symptoms since initiating Taltz .  She is not currently experiencing any arthralgias, joint stiffness, or joint swelling.  She denies any Achilles tendinitis or plantar fasciitis.  Her right knee joint pain has improved since having a cortisone injection on 01/22/2024.  She denies any recurrence of warmth or swelling in the right knee.  She denies any SI joint pain.  Patient states that she continues to have some dryness and itching on her scalp and both ears.  Patient states that she never picked up the prescription for clobetasol  solution but would like a prescription sent today. She denies any recent or recurrent infections.   Activities of Daily Living:  Patient reports morning stiffness for 15 minutes.   Patient Denies nocturnal pain.  Difficulty dressing/grooming: Denies Difficulty climbing stairs: Reports Difficulty getting out of chair: Reports Difficulty using hands for taps, buttons, cutlery, and/or writing: Denies  Review of Systems  Constitutional:  Negative for fatigue.  HENT:  Negative for mouth sores and mouth dryness.   Eyes:  Negative for dryness.  Respiratory:  Negative for shortness of breath.   Cardiovascular:  Negative for chest pain and palpitations.  Gastrointestinal:  Negative for blood in stool, constipation and diarrhea.  Endocrine: Negative for increased urination.   Genitourinary:  Negative for involuntary urination.  Musculoskeletal:  Positive for joint pain, joint pain, joint swelling, myalgias, morning stiffness and myalgias. Negative for gait problem, muscle weakness and muscle tenderness.  Skin:  Positive for sensitivity to sunlight. Negative for color change, rash and hair loss.  Allergic/Immunologic: Negative for susceptible to infections.  Neurological:  Negative for dizziness and headaches.  Hematological:  Negative for swollen glands.  Psychiatric/Behavioral:  Positive for depressed mood. Negative for sleep disturbance. The patient is not nervous/anxious.     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   Essential hypertension 01/20/2017   History of depression 01/20/2017   Psoriatic arthropathy (HCC) 01/16/2017   Psoriasis 01/16/2017   Primary osteoarthritis of both feet 01/16/2017   Primary osteoarthritis of both hands 01/16/2017   Primary osteoarthritis of both knees 01/16/2017    Past Medical History:  Diagnosis Date   Hypertension 02/2024   Psoriatic arthritis (HCC)     Family History  Problem Relation Age of Onset   Alzheimer's disease Mother    Heart failure Mother    Parkinson's disease Father    Cancer Father        prostate   Cancer Sister        breast cancer    Healthy Daughter    Healthy Daughter    Past Surgical History:  Procedure Laterality Date   CHOLECYSTECTOMY  03/10/2021  TUBAL LIGATION     Social History   Social History Narrative   Not on file   Immunization History  Administered Date(s) Administered   Moderna Sars-Covid-2 Vaccination 01/09/2020, 02/08/2020     Objective: Vital Signs: BP (!) 157/93 (BP Location: Left Arm, Patient Position: Sitting, Cuff Size: Large)   Pulse 68   Resp 14   Ht 5' 5 (1.651 m)   Wt 241 lb 9.6 oz (109.6 kg)   BMI 40.20 kg/m    Physical Exam Vitals and nursing  note reviewed.  Constitutional:      Appearance: She is well-developed.  HENT:     Head: Normocephalic and atraumatic.  Eyes:     Conjunctiva/sclera: Conjunctivae normal.  Cardiovascular:     Rate and Rhythm: Normal rate and regular rhythm.     Heart sounds: Normal heart sounds.  Pulmonary:     Effort: Pulmonary effort is normal.     Breath sounds: Normal breath sounds.  Abdominal:     General: Bowel sounds are normal.     Palpations: Abdomen is soft.  Musculoskeletal:     Cervical back: Normal range of motion.  Lymphadenopathy:     Cervical: No cervical adenopathy.  Skin:    General: Skin is warm and dry.     Capillary Refill: Capillary refill takes less than 2 seconds.  Neurological:     Mental Status: She is alert and oriented to person, place, and time.  Psychiatric:        Behavior: Behavior normal.      Musculoskeletal Exam: C-spine, thoracic spine, lumbar spine have good range of motion.  No midline spinal tenderness.  No SI joint tenderness.  Shoulder joints, elbow joints and wrist joints and MCPs, PIPs, DIPs have good range of motion with no synovitis.  Complete fist formation bilaterally.  PIP and DIP thickening consistent with osteoarthritis of both hands.  Hip joints have good range of motion no groin pain.  Knee joints have good range of motion no warmth or effusion.  Ankle joints have good range of motion no tenderness.  Pedal edema noted in bilateral lower extremities.  No evidence of Achilles tendinitis.  CDAI Exam: CDAI Score: -- Patient Global: --; Provider Global: -- Swollen: --; Tender: -- Joint Exam 06/19/2024   No joint exam has been documented for this visit   There is currently no information documented on the homunculus. Go to the Rheumatology activity and complete the homunculus joint exam.  Investigation: No additional findings.  Imaging: No results found.  Recent Labs: Lab Results  Component Value Date   WBC 4.0 03/19/2024   HGB 13.2  03/19/2024   PLT 210 03/19/2024   NA 136 03/19/2024   K 4.4 03/19/2024   CL 100 03/19/2024   CO2 27 03/19/2024   GLUCOSE 101 (H) 03/19/2024   BUN 14 03/19/2024   CREATININE 0.81 03/19/2024   BILITOT 0.7 03/19/2024   ALKPHOS 62 06/13/2017   AST 14 03/19/2024   ALT 13 03/19/2024   PROT 6.5 03/19/2024   ALBUMIN 4.1 06/13/2017   CALCIUM 8.8 03/19/2024   GFRAA 109 11/17/2020   QFTBGOLDPLUS NEGATIVE 08/16/2023    Speciality Comments: No specialty comments available.  Procedures:  No procedures performed Allergies: Patient has no known allergies.   Assessment / Plan:     Visit Diagnoses: Psoriatic arthropathy (HCC): She has no synovitis or dactylitis on examination today.  No SI joint tenderness upon palpation.  No evidence of Achilles tendinitis or plantar fasciitis.  She has not had any recurrence of right knee joint pain or swelling since having a cortisone injection performed on 01/22/2024.  She has clinically been doing well on Taltz  80 mg sq injections once every 28 days.  She is tolerating Taltz  without any side effects and has not had any recent gaps in therapy.  No recent or recurrent infections.  No medication changes will be made at this time.  She was advised to notify us  if she develops any signs or symptoms of a flare.  She will follow-up in the office in 5 months or sooner if needed.  High risk medication use - Taltz  80 mg sq injections every 28 days.  IR Adalimumab -adaz 40 mg sq injections q14 days. Previous tx: Enbrel, Humira .  CBC and CMP updated on 03/19/24. Orders for CBC and CMP released today.  Her next lab work will be due in December and every 3 months to monitor for drug toxicity. TB gold negative on 08/16/23.  Plan to update TB Gold with next lab work in December 2025. No recent or recurrent infections.  Discussed the importance of holding taltz  if she develops signs or symptoms of an infection and to resume once the infection has completely cleared.  - Plan: CBC  with Differential/Platelet, Comprehensive metabolic panel with GFR  Primary osteoarthritis of both hands: PIP and DIP prominence consistent with osteoarthritic changes noted.  No synovitis noted on exam.  Complete fist formation bilaterally.  Discussed the importance of joint protection and muscle strengthening.  Chronic pain of right knee -X-ray of the right knee knee 06/01/2021 moderate osteoarthritis.  She had a right knee joint cortisone injection performed on 08/16/2023 and 01/22/24.  No recurrence of pain or inflammation.  No warmth or effusion noted today.  Primary osteoarthritis of both knees: She has good range of motion of both knee joints on examination today.  No warmth or effusion noted.  She has not had any recurrence of right knee joint pain since having a cortisone injection performed on 01/22/2024.  Primary osteoarthritis of both feet: She has good range of motion of both ankle joints with no tenderness or joint swelling.  No evidence of Achilles tendinitis or plantar fasciitis.  Lumbar spondylosis: No symptoms of radiculopathy.  No midline spinal tenderness.  Medial epicondylitis of elbow, right: Not currently symptomatic.   Closed compression fracture of body of L1 vertebra (HCC) - - 03/28/2022: Degenerative joint changes ower thoracic spine, lumbar spine.  20% chronic compression deformity of L1.  She remains on Fosamax 70 mg 1 tablet by mouth once weekly.  Trochanteric bursitis, left hip: Not currently symptomatic.  Vitamin D  deficiency: She is taking a daily calcium and vitamin D  supplement.  History of hypertension: Blood pressure was elevated today in the office and was rechecked prior to leaving.  Patient was advised to monitor blood pressure closely and to reach out to PCP if her blood pressure remains elevated.  Other fatigue  Orders: Orders Placed This Encounter  Procedures   CBC with Differential/Platelet   Comprehensive metabolic panel with GFR   Meds ordered  this encounter  Medications   clobetasol  (TEMOVATE ) 0.05 % external solution    Sig: Apply 1 Application topically 2 (two) times daily.    Dispense:  50 mL    Refill:  0   Follow-Up Instructions: No follow-ups on file.   Waddell CHRISTELLA Craze, PA-C  Note - This record has been created using Dragon software.  Chart creation errors have been sought, but  may not always  have been located. Such creation errors do not reflect on  the standard of medical care.

## 2024-06-19 ENCOUNTER — Ambulatory Visit: Attending: Physician Assistant | Admitting: Physician Assistant

## 2024-06-19 ENCOUNTER — Encounter: Payer: Self-pay | Admitting: Physician Assistant

## 2024-06-19 ENCOUNTER — Other Ambulatory Visit: Payer: Self-pay

## 2024-06-19 VITALS — BP 157/93 | HR 68 | Resp 14 | Ht 65.0 in | Wt 241.6 lb

## 2024-06-19 DIAGNOSIS — Z79899 Other long term (current) drug therapy: Secondary | ICD-10-CM

## 2024-06-19 DIAGNOSIS — Z111 Encounter for screening for respiratory tuberculosis: Secondary | ICD-10-CM | POA: Insufficient documentation

## 2024-06-19 DIAGNOSIS — G8929 Other chronic pain: Secondary | ICD-10-CM | POA: Diagnosis present

## 2024-06-19 DIAGNOSIS — Z8679 Personal history of other diseases of the circulatory system: Secondary | ICD-10-CM | POA: Insufficient documentation

## 2024-06-19 DIAGNOSIS — M19072 Primary osteoarthritis, left ankle and foot: Secondary | ICD-10-CM | POA: Insufficient documentation

## 2024-06-19 DIAGNOSIS — M25561 Pain in right knee: Secondary | ICD-10-CM | POA: Insufficient documentation

## 2024-06-19 DIAGNOSIS — M7062 Trochanteric bursitis, left hip: Secondary | ICD-10-CM | POA: Diagnosis present

## 2024-06-19 DIAGNOSIS — S32010A Wedge compression fracture of first lumbar vertebra, initial encounter for closed fracture: Secondary | ICD-10-CM | POA: Insufficient documentation

## 2024-06-19 DIAGNOSIS — R5383 Other fatigue: Secondary | ICD-10-CM | POA: Diagnosis present

## 2024-06-19 DIAGNOSIS — M19041 Primary osteoarthritis, right hand: Secondary | ICD-10-CM | POA: Insufficient documentation

## 2024-06-19 DIAGNOSIS — M19071 Primary osteoarthritis, right ankle and foot: Secondary | ICD-10-CM | POA: Insufficient documentation

## 2024-06-19 DIAGNOSIS — L4050 Arthropathic psoriasis, unspecified: Secondary | ICD-10-CM | POA: Insufficient documentation

## 2024-06-19 DIAGNOSIS — M170 Bilateral primary osteoarthritis of knee: Secondary | ICD-10-CM | POA: Diagnosis present

## 2024-06-19 DIAGNOSIS — L409 Psoriasis, unspecified: Secondary | ICD-10-CM

## 2024-06-19 DIAGNOSIS — M7701 Medial epicondylitis, right elbow: Secondary | ICD-10-CM | POA: Insufficient documentation

## 2024-06-19 DIAGNOSIS — E559 Vitamin D deficiency, unspecified: Secondary | ICD-10-CM | POA: Insufficient documentation

## 2024-06-19 DIAGNOSIS — M19042 Primary osteoarthritis, left hand: Secondary | ICD-10-CM | POA: Diagnosis present

## 2024-06-19 DIAGNOSIS — M47816 Spondylosis without myelopathy or radiculopathy, lumbar region: Secondary | ICD-10-CM | POA: Insufficient documentation

## 2024-06-19 LAB — CBC WITH DIFFERENTIAL/PLATELET
Absolute Lymphocytes: 1513 {cells}/uL (ref 850–3900)
Absolute Monocytes: 525 {cells}/uL (ref 200–950)
Basophils Absolute: 68 {cells}/uL (ref 0–200)
Basophils Relative: 1.3 %
Eosinophils Absolute: 78 {cells}/uL (ref 15–500)
Eosinophils Relative: 1.5 %
HCT: 40.7 % (ref 35.0–45.0)
Hemoglobin: 13 g/dL (ref 11.7–15.5)
MCH: 27.7 pg (ref 27.0–33.0)
MCHC: 31.9 g/dL — ABNORMAL LOW (ref 32.0–36.0)
MCV: 86.6 fL (ref 80.0–100.0)
MPV: 11 fL (ref 7.5–12.5)
Monocytes Relative: 10.1 %
Neutro Abs: 3016 {cells}/uL (ref 1500–7800)
Neutrophils Relative %: 58 %
Platelets: 247 Thousand/uL (ref 140–400)
RBC: 4.7 Million/uL (ref 3.80–5.10)
RDW: 14.6 % (ref 11.0–15.0)
Total Lymphocyte: 29.1 %
WBC: 5.2 Thousand/uL (ref 3.8–10.8)

## 2024-06-19 LAB — COMPREHENSIVE METABOLIC PANEL WITH GFR
AG Ratio: 1.7 (calc) (ref 1.0–2.5)
ALT: 19 U/L (ref 6–29)
AST: 13 U/L (ref 10–35)
Albumin: 4.1 g/dL (ref 3.6–5.1)
Alkaline phosphatase (APISO): 78 U/L (ref 37–153)
BUN: 13 mg/dL (ref 7–25)
CO2: 29 mmol/L (ref 20–32)
Calcium: 9 mg/dL (ref 8.6–10.4)
Chloride: 104 mmol/L (ref 98–110)
Creat: 0.67 mg/dL (ref 0.50–1.05)
Globulin: 2.4 g/dL (ref 1.9–3.7)
Glucose, Bld: 92 mg/dL (ref 65–99)
Potassium: 4.4 mmol/L (ref 3.5–5.3)
Sodium: 140 mmol/L (ref 135–146)
Total Bilirubin: 0.5 mg/dL (ref 0.2–1.2)
Total Protein: 6.5 g/dL (ref 6.1–8.1)
eGFR: 96 mL/min/1.73m2 (ref >=60)

## 2024-06-19 MED ORDER — IXEKIZUMAB 80 MG/ML ~~LOC~~ SOSY: 80.0000 mg | PREFILLED_SYRINGE | SUBCUTANEOUS | 0 refills | Status: DC

## 2024-06-19 MED ORDER — CLOBETASOL PROPIONATE 0.05 % EX SOLN: 1.0000 | Freq: Two times a day (BID) | CUTANEOUS | 0 refills | Status: AC

## 2024-06-19 NOTE — Progress Notes (Signed)
 Patient seen in office today, please review and sign.

## 2024-06-19 NOTE — Patient Instructions (Signed)

## 2024-06-20 ENCOUNTER — Ambulatory Visit: Payer: Self-pay | Admitting: Rheumatology

## 2024-06-20 NOTE — Progress Notes (Signed)
 CBC and CMP are stable.

## 2024-08-26 ENCOUNTER — Telehealth: Payer: Self-pay | Admitting: Pharmacist

## 2024-08-26 NOTE — Telephone Encounter (Signed)
 Patient's Taltz  PAP renewal application mailed to her home.  Prescriber form placed in Dr. Jammie folder for signature  [name], PharmD, MPH, BCPS, CPP Clinical Pharmacist [facility])

## 2024-08-27 NOTE — Telephone Encounter (Addendum)
 Received signed provider form from Waddell Craze, PA-C. Placed in PAP pending info folder in [place]'s office  Submitted a Prior Authorization RENEWAL request to [employer] for TALTZ  via CoverMyMeds. Will update once we receive a response.  Key: [id number]

## 2024-08-28 NOTE — Telephone Encounter (Signed)
 Patient contacted the office regarding her Taltz . Advised the patient the prior authorization has been submitted and the pharmacy team is waiting to hear back. Advised the patient a patient assistance for has been sent to her house. Advised the patient once she receives the paperwork, she should fill it out and bring it by the office so the rest of the information for the patient assistance can be faxed in.

## 2024-09-19 NOTE — Telephone Encounter (Signed)
 Per response on CMM: An active PA is already on file with expiration date of 02/07/2025. Please wait to resubmit request within 60 days of that expiration date to obtain a PA renewal.

## 2024-09-23 ENCOUNTER — Other Ambulatory Visit: Payer: Self-pay | Admitting: Physician Assistant

## 2024-09-23 DIAGNOSIS — Z111 Encounter for screening for respiratory tuberculosis: Secondary | ICD-10-CM

## 2024-09-23 DIAGNOSIS — Z79899 Other long term (current) drug therapy: Secondary | ICD-10-CM

## 2024-09-23 NOTE — Telephone Encounter (Signed)
 Last Fill: 06/19/2024  Labs: 06/19/2024 CBC and CMP are stable.   TB Gold: 08/16/2023 Neg    Next Visit: 11/19/2024  Last Visit: 06/19/2024  IK:Ednmpjupr arthropathy   Current Dose per office note 06/19/2024: Taltz  80 mg sq injections every 28 days   Patient advised she is due to update her labs. Patient states she will go to update her labs one day this week.   Okay to refill Taltz ?

## 2024-11-05 ENCOUNTER — Other Ambulatory Visit: Payer: Self-pay | Admitting: Physician Assistant

## 2024-11-05 NOTE — Telephone Encounter (Signed)
 Last Fill: 09/23/2024  Labs: 06/19/2024 CBC and CMP are stable.   TB Gold: 08/16/2023    Next Visit: 11/19/2024  Last Visit: 06/19/2024  IK:Ednmpjupr arthropathy (HCC)   Current Dose per office note 06/19/2024: Taltz  80 mg sq injections every 28 days   Patient to update labs at upcomming appointment. Labs pended in upcomming appointment.   Okay to refill Taltz ?

## 2024-11-05 NOTE — Progress Notes (Unsigned)
 "  Office Visit Note  Patient: Karen Bryant             Date of Birth: 1956-03-30           MRN: [id number]             PCP: Maree Isles, MD Referring: Maree Isles, MD Visit Date: 11/19/2024 Occupation: Data Unavailable  Subjective:  No chief complaint on file.   History of Present Illness: Karen Bryant is a 69 y.o. female ***     Activities of Daily Living:  Patient reports morning stiffness for *** {minute/hour:19697}.   Patient {ACTIONS;DENIES/REPORTS:21021675::Denies} nocturnal pain.  Difficulty dressing/grooming: {ACTIONS;DENIES/REPORTS:21021675::Denies} Difficulty climbing stairs: {ACTIONS;DENIES/REPORTS:21021675::Denies} Difficulty getting out of chair: {ACTIONS;DENIES/REPORTS:21021675::Denies} Difficulty using hands for taps, buttons, cutlery, and/or writing: {ACTIONS;DENIES/REPORTS:21021675::Denies}  No Rheumatology ROS completed.   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   Essential hypertension 01/20/2017   History of depression 01/20/2017   Psoriatic arthropathy (HCC) 01/16/2017   Psoriasis 01/16/2017   Primary osteoarthritis of both feet 01/16/2017   Primary osteoarthritis of both hands 01/16/2017   Primary osteoarthritis of both knees 01/16/2017    Past Medical History:  Diagnosis Date   Hypertension 02/2024   Psoriatic arthritis (HCC)     Family History  Problem Relation Age of Onset   Alzheimer's disease Mother    Heart failure Mother    Parkinson's disease Father    Cancer Father        prostate   Cancer Sister        breast cancer    Healthy Daughter    Healthy Daughter    Past Surgical History:  Procedure Laterality Date   CHOLECYSTECTOMY  03/10/2021   TUBAL LIGATION     Social History[1] Social History   Social History Narrative   Not on file     Immunization History  Administered Date(s) Administered   Moderna  Sars-Covid-2 Vaccination 01/09/2020, 02/08/2020     Objective: Vital Signs: There were no vitals taken for this visit.   Physical Exam   Musculoskeletal Exam: ***  CDAI Exam: CDAI Score: -- Patient Global: --; Provider Global: -- Swollen: --; Tender: -- Joint Exam 11/19/2024   No joint exam has been documented for this visit   There is currently no information documented on the homunculus. Go to the Rheumatology activity and complete the homunculus joint exam.  Investigation: No additional findings.  Imaging: No results found.  Recent Labs: Lab Results  Component Value Date   WBC 5.2 06/19/2024   HGB 13.0 06/19/2024   PLT 247 06/19/2024   NA 140 06/19/2024   K 4.4 06/19/2024   CL 104 06/19/2024   CO2 29 06/19/2024   GLUCOSE 92 06/19/2024   BUN 13 06/19/2024   CREATININE 0.67 06/19/2024   BILITOT 0.5 06/19/2024   ALKPHOS 62 06/13/2017   AST 13 06/19/2024   ALT 19 06/19/2024   PROT 6.5 06/19/2024   ALBUMIN 4.1 06/13/2017   CALCIUM 9.0 06/19/2024   GFRAA 109 11/17/2020   QFTBGOLDPLUS NEGATIVE 08/16/2023    Speciality Comments: No specialty comments available.  Procedures:  No procedures performed Allergies: Patient has no known allergies.   Assessment / Plan:     Visit Diagnoses: Psoriatic arthropathy (HCC)  Psoriasis  High risk medication use  Primary osteoarthritis of both hands  Chronic pain of right knee  Primary osteoarthritis of both knees  Primary osteoarthritis of  both feet  Lumbar spondylosis  Medial epicondylitis of elbow, right  Closed compression fracture of body of L1 vertebra (HCC)  Trochanteric bursitis, left hip  Vitamin D  deficiency  History of hypertension  Other fatigue  Orders: No orders of the defined types were placed in this encounter.  No orders of the defined types were placed in this encounter.   Face-to-face time spent with patient was *** minutes. Greater than 50% of time was spent in counseling and  coordination of care.  Follow-Up Instructions: No follow-ups on file.   Waddell CHRISTELLA Craze, PA-C  Note - This record has been created using Dragon software.  Chart creation errors have been sought, but may not always  have been located. Such creation errors do not reflect on  the standard of medical care.     [1]  Social History Tobacco Use   Smoking status: Never    Passive exposure: Never   Smokeless tobacco: Never  Vaping Use   Vaping status: Never Used  Substance Use Topics   Alcohol use: No   Drug use: Never   "

## 2024-11-19 ENCOUNTER — Ambulatory Visit: Admitting: Physician Assistant

## 2024-11-19 DIAGNOSIS — E559 Vitamin D deficiency, unspecified: Secondary | ICD-10-CM

## 2024-11-19 DIAGNOSIS — L409 Psoriasis, unspecified: Secondary | ICD-10-CM

## 2024-11-19 DIAGNOSIS — Z8679 Personal history of other diseases of the circulatory system: Secondary | ICD-10-CM

## 2024-11-19 DIAGNOSIS — M7701 Medial epicondylitis, right elbow: Secondary | ICD-10-CM

## 2024-11-19 DIAGNOSIS — M170 Bilateral primary osteoarthritis of knee: Secondary | ICD-10-CM

## 2024-11-19 DIAGNOSIS — L4050 Arthropathic psoriasis, unspecified: Secondary | ICD-10-CM

## 2024-11-19 DIAGNOSIS — M47816 Spondylosis without myelopathy or radiculopathy, lumbar region: Secondary | ICD-10-CM

## 2024-11-19 DIAGNOSIS — M19041 Primary osteoarthritis, right hand: Secondary | ICD-10-CM

## 2024-11-19 DIAGNOSIS — R5383 Other fatigue: Secondary | ICD-10-CM

## 2024-11-19 DIAGNOSIS — G8929 Other chronic pain: Secondary | ICD-10-CM

## 2024-11-19 DIAGNOSIS — M19071 Primary osteoarthritis, right ankle and foot: Secondary | ICD-10-CM

## 2024-11-19 DIAGNOSIS — S32010A Wedge compression fracture of first lumbar vertebra, initial encounter for closed fracture: Secondary | ICD-10-CM

## 2024-11-19 DIAGNOSIS — Z79899 Other long term (current) drug therapy: Secondary | ICD-10-CM

## 2024-11-19 DIAGNOSIS — Z111 Encounter for screening for respiratory tuberculosis: Secondary | ICD-10-CM

## 2024-11-19 DIAGNOSIS — M7062 Trochanteric bursitis, left hip: Secondary | ICD-10-CM
